# Patient Record
Sex: Male | Born: 1980 | Race: Black or African American | Hispanic: No | Marital: Married | Smoking: Current every day smoker
Health system: Southern US, Community
[De-identification: ages and names within clinical notes are randomized; demographics above are authoritative.]

## PROBLEM LIST (undated history)

## (undated) DIAGNOSIS — J45909 Unspecified asthma, uncomplicated: Secondary | ICD-10-CM

## (undated) DIAGNOSIS — K859 Acute pancreatitis without necrosis or infection, unspecified: Secondary | ICD-10-CM

---

## 1998-06-03 ENCOUNTER — Encounter: Payer: Self-pay | Admitting: Emergency Medicine

## 1998-06-03 ENCOUNTER — Encounter: Payer: Self-pay | Admitting: Infectious Diseases

## 1998-06-03 ENCOUNTER — Inpatient Hospital Stay (HOSPITAL_COMMUNITY): Admission: EM | Admit: 1998-06-03 | Discharge: 1998-06-04 | Payer: Self-pay | Admitting: Emergency Medicine

## 1999-04-20 ENCOUNTER — Emergency Department (HOSPITAL_COMMUNITY): Admission: EM | Admit: 1999-04-20 | Discharge: 1999-04-20 | Payer: Self-pay | Admitting: *Deleted

## 1999-07-04 ENCOUNTER — Emergency Department (HOSPITAL_COMMUNITY): Admission: EM | Admit: 1999-07-04 | Discharge: 1999-07-04 | Payer: Self-pay | Admitting: Emergency Medicine

## 2000-09-02 ENCOUNTER — Emergency Department (HOSPITAL_COMMUNITY): Admission: EM | Admit: 2000-09-02 | Discharge: 2000-09-02 | Payer: Self-pay | Admitting: Emergency Medicine

## 2002-07-04 ENCOUNTER — Emergency Department (HOSPITAL_COMMUNITY): Admission: EM | Admit: 2002-07-04 | Discharge: 2002-07-04 | Payer: Self-pay | Admitting: Emergency Medicine

## 2003-01-19 ENCOUNTER — Emergency Department (HOSPITAL_COMMUNITY): Admission: EM | Admit: 2003-01-19 | Discharge: 2003-01-19 | Payer: Self-pay | Admitting: Emergency Medicine

## 2004-07-27 ENCOUNTER — Emergency Department (HOSPITAL_COMMUNITY): Admission: EM | Admit: 2004-07-27 | Discharge: 2004-07-27 | Payer: Self-pay | Admitting: Family Medicine

## 2005-03-11 ENCOUNTER — Emergency Department (HOSPITAL_COMMUNITY): Admission: EM | Admit: 2005-03-11 | Discharge: 2005-03-11 | Payer: Self-pay | Admitting: Emergency Medicine

## 2007-06-01 ENCOUNTER — Emergency Department (HOSPITAL_COMMUNITY): Admission: EM | Admit: 2007-06-01 | Discharge: 2007-06-01 | Payer: Self-pay | Admitting: Emergency Medicine

## 2008-03-05 ENCOUNTER — Emergency Department (HOSPITAL_COMMUNITY): Admission: EM | Admit: 2008-03-05 | Discharge: 2008-03-06 | Payer: Self-pay | Admitting: Emergency Medicine

## 2009-03-26 ENCOUNTER — Emergency Department (HOSPITAL_COMMUNITY): Admission: EM | Admit: 2009-03-26 | Discharge: 2009-03-26 | Payer: Self-pay | Admitting: Family Medicine

## 2010-08-24 ENCOUNTER — Emergency Department (HOSPITAL_COMMUNITY)
Admission: EM | Admit: 2010-08-24 | Discharge: 2010-08-24 | Payer: Self-pay | Source: Home / Self Care | Admitting: Emergency Medicine

## 2010-08-26 LAB — POCT URINALYSIS DIPSTICK
Bilirubin Urine: NEGATIVE
Hgb urine dipstick: NEGATIVE
Ketones, ur: NEGATIVE mg/dL
Nitrite: NEGATIVE
Protein, ur: NEGATIVE mg/dL
Specific Gravity, Urine: 1.02 (ref 1.005–1.030)
Urine Glucose, Fasting: NEGATIVE mg/dL
Urobilinogen, UA: 1 mg/dL (ref 0.0–1.0)
pH: 7.5 (ref 5.0–8.0)

## 2010-08-26 LAB — GC/CHLAMYDIA PROBE AMP, GENITAL
Chlamydia, DNA Probe: NEGATIVE
GC Probe Amp, Genital: NEGATIVE

## 2010-11-14 LAB — POCT URINALYSIS DIP (DEVICE)
Bilirubin Urine: NEGATIVE
Glucose, UA: NEGATIVE mg/dL
Hgb urine dipstick: NEGATIVE
Ketones, ur: NEGATIVE mg/dL
Nitrite: NEGATIVE
Protein, ur: NEGATIVE mg/dL
Specific Gravity, Urine: 1.02 (ref 1.005–1.030)
Urobilinogen, UA: 1 mg/dL (ref 0.0–1.0)
pH: 8.5 — ABNORMAL HIGH (ref 5.0–8.0)

## 2011-05-07 LAB — POCT I-STAT, CHEM 8
BUN: 9
Calcium, Ion: 1.1 — ABNORMAL LOW
Chloride: 103
Creatinine, Ser: 1.3
Glucose, Bld: 97
HCT: 50
Hemoglobin: 17
Potassium: 3.5
Sodium: 135
TCO2: 24

## 2011-05-07 LAB — URINALYSIS, ROUTINE W REFLEX MICROSCOPIC
Glucose, UA: NEGATIVE
Hgb urine dipstick: NEGATIVE
Ketones, ur: >80 — AB
Nitrite: NEGATIVE
Protein, ur: 30 — AB
Specific Gravity, Urine: 1.036 — ABNORMAL HIGH
Urobilinogen, UA: 1
pH: 8

## 2011-05-07 LAB — URINE MICROSCOPIC-ADD ON

## 2011-11-12 ENCOUNTER — Encounter (HOSPITAL_COMMUNITY): Payer: Self-pay | Admitting: *Deleted

## 2011-11-12 ENCOUNTER — Emergency Department (INDEPENDENT_AMBULATORY_CARE_PROVIDER_SITE_OTHER)
Admission: EM | Admit: 2011-11-12 | Discharge: 2011-11-12 | Disposition: A | Discharge status: Home Health | Payer: Self-pay | Source: Home / Self Care | Attending: Family Medicine | Admitting: Family Medicine

## 2011-11-12 ENCOUNTER — Emergency Department (INDEPENDENT_AMBULATORY_CARE_PROVIDER_SITE_OTHER): Payer: Self-pay

## 2011-11-12 DIAGNOSIS — J111 Influenza due to unidentified influenza virus with other respiratory manifestations: Secondary | ICD-10-CM

## 2011-11-12 DIAGNOSIS — R6889 Other general symptoms and signs: Secondary | ICD-10-CM

## 2011-11-12 MED ORDER — ONDANSETRON HCL 4 MG PO TABS: 4.0000 mg | ORAL_TABLET | Freq: Four times a day (QID) | ORAL | Status: AC

## 2011-11-12 MED ORDER — IBUPROFEN 800 MG PO TABS
800.0000 mg | ORAL_TABLET | Freq: Once | ORAL | Status: AC
  Administered 2011-11-12: 800 mg via ORAL

## 2011-11-12 MED ORDER — ONDANSETRON 4 MG PO TBDP
ORAL_TABLET | ORAL | Status: AC
  Filled 2011-11-12: qty 1

## 2011-11-12 MED ORDER — IBUPROFEN 800 MG PO TABS
ORAL_TABLET | ORAL | Status: AC
  Filled 2011-11-12: qty 1

## 2011-11-12 MED ORDER — IBUPROFEN 800 MG PO TABS: 800.0000 mg | ORAL_TABLET | Freq: Three times a day (TID) | ORAL | Status: AC

## 2011-11-12 MED ORDER — ONDANSETRON 4 MG PO TBDP
4.0000 mg | ORAL_TABLET | Freq: Once | ORAL | Status: AC
  Administered 2011-11-12: 4 mg via ORAL

## 2011-11-12 NOTE — ED Provider Notes (Signed)
History     CSN: [id number]  Arrival date & time 11/12/11  1027   First MD Initiated Contact with Patient 11/12/11 1053      Chief Complaint  Patient presents with  . Cough  . Fever  . Generalized Body Aches    (Consider location/radiation/quality/duration/timing/severity/associated sxs/prior treatment) Patient is a 31 y.o. male presenting with cough and fever. The history is provided by the patient.  Cough This is a new problem. The current episode started more than 2 days ago. The problem occurs constantly. The problem has been gradually worsening. The cough is productive of purulent sputum. Associated symptoms include rhinorrhea, sore throat and myalgias. He is a smoker.  Fever Primary symptoms of the febrile illness include fever, cough, nausea, vomiting, diarrhea and myalgias.    History reviewed. No pertinent past medical history.  History reviewed. No pertinent past surgical history.  History reviewed. No pertinent family history.  History  Substance Use Topics  . Smoking status: Current Everyday Smoker  . Smokeless tobacco: Not on file  . Alcohol Use: No      Review of Systems  Constitutional: Positive for fever.  HENT: Positive for congestion, sore throat and rhinorrhea.   Respiratory: Positive for cough.   Gastrointestinal: Positive for nausea, vomiting and diarrhea.  Musculoskeletal: Positive for myalgias.  Skin: Negative.     Allergies  Review of patient's allergies indicates no known allergies.  Home Medications   Current Outpatient Rx  Name Route Sig Dispense Refill  . IBUPROFEN 800 MG PO TABS Oral Take 1 tablet (800 mg total) by mouth 3 (three) times daily. 30 tablet 0  . ONDANSETRON HCL 4 MG PO TABS Oral Take 1 tablet (4 mg total) by mouth every 6 (six) hours. 8 tablet 0    BP 107/72  Pulse 103  Temp(Src) 101.5 F (38.6 C) (Oral)  Resp 16  SpO2 99%  Physical Exam  Nursing note and vitals reviewed. Constitutional: He is oriented to  person, place, and time. He appears well-developed and well-nourished.  HENT:  Head: Normocephalic.  Right Ear: Tympanic membrane is injected, erythematous and bulging.  Left Ear: Tympanic membrane and external ear normal.  Mouth/Throat: Oropharynx is clear and moist.  Eyes: Conjunctivae are normal. Pupils are equal, round, and reactive to light.  Neck: Normal range of motion. Neck supple.  Cardiovascular: Normal rate and regular rhythm.   Pulmonary/Chest: He has no wheezes. He has rhonchi. He has no rales.  Abdominal: Soft. Bowel sounds are normal. There is tenderness.  Lymphadenopathy:    He has no cervical adenopathy.  Neurological: He is alert and oriented to person, place, and time.  Skin: Skin is warm and dry.    ED Course  Procedures (including critical care time)  Labs Reviewed - No data to display Dg Chest 2 View  11/12/2011  *RADIOLOGY REPORT*  Clinical Data: Productive cough with chest pain and shortness of breath.  History of smoking.  CHEST - 2 VIEW  Comparison: 03/05/2008  Findings: Normal heart size with clear lung fields.  Moderate hyperinflation suggesting COPD.  No effusion or pneumothorax.  No hilar mediastinal contour abnormalities.  Bones unremarkable. Little change from priors.  IMPRESSION: Suspect mild hyperinflation.  No active disease.  Original Report Authenticated By: Elsie Stain, M.D.     1. Influenza-like symptoms       MDM  X-rays reviewed and report per radiologist.         Linna Hoff, MD 11/12/11 1251

## 2011-11-12 NOTE — Discharge Instructions (Signed)
Drink plenty of fluids as discussed, use medicine as prescribed, and mucinex or delsym for cough. Return or see your doctor if further problems °

## 2011-11-12 NOTE — ED Notes (Signed)
Pt is here with complaints of body aches, fever, sore throat, cough and vomiting X 3 days.

## 2013-01-04 ENCOUNTER — Emergency Department (HOSPITAL_COMMUNITY)
Admission: EM | Admit: 2013-01-04 | Discharge: 2013-01-05 | Disposition: A | Discharge status: Home / Self Care | Payer: Self-pay | Attending: Emergency Medicine | Admitting: Emergency Medicine

## 2013-01-04 ENCOUNTER — Emergency Department (INDEPENDENT_AMBULATORY_CARE_PROVIDER_SITE_OTHER)
Admission: EM | Admit: 2013-01-04 | Discharge: 2013-01-04 | Disposition: A | Discharge status: Nursing Home (Includes ICF) | Payer: Self-pay | Source: Home / Self Care

## 2013-01-04 ENCOUNTER — Encounter (HOSPITAL_COMMUNITY): Payer: Self-pay | Admitting: Emergency Medicine

## 2013-01-04 ENCOUNTER — Emergency Department (HOSPITAL_COMMUNITY): Payer: Self-pay

## 2013-01-04 ENCOUNTER — Encounter (HOSPITAL_COMMUNITY): Payer: Self-pay | Admitting: *Deleted

## 2013-01-04 DIAGNOSIS — R109 Unspecified abdominal pain: Secondary | ICD-10-CM

## 2013-01-04 DIAGNOSIS — J029 Acute pharyngitis, unspecified: Secondary | ICD-10-CM | POA: Insufficient documentation

## 2013-01-04 DIAGNOSIS — R509 Fever, unspecified: Secondary | ICD-10-CM

## 2013-01-04 DIAGNOSIS — F17200 Nicotine dependence, unspecified, uncomplicated: Secondary | ICD-10-CM | POA: Insufficient documentation

## 2013-01-04 DIAGNOSIS — R1011 Right upper quadrant pain: Secondary | ICD-10-CM | POA: Insufficient documentation

## 2013-01-04 LAB — CBC
HCT: 47.1 % (ref 39.0–52.0)
Hemoglobin: 16.9 g/dL (ref 13.0–17.0)
MCH: 30.1 pg (ref 26.0–34.0)
MCHC: 35.9 g/dL (ref 30.0–36.0)
MCV: 83.8 fL (ref 78.0–100.0)
Platelets: 162 10*3/uL (ref 150–400)
RBC: 5.62 MIL/uL (ref 4.22–5.81)
RDW: 13.1 % (ref 11.5–15.5)
WBC: 16.9 10*3/uL — ABNORMAL HIGH (ref 4.0–10.5)

## 2013-01-04 LAB — COMPREHENSIVE METABOLIC PANEL
ALT: 31 U/L (ref 0–53)
AST: 21 U/L (ref 0–37)
Albumin: 4.1 g/dL (ref 3.5–5.2)
Alkaline Phosphatase: 75 U/L (ref 39–117)
BUN: 12 mg/dL (ref 6–23)
CO2: 24 mEq/L (ref 19–32)
Calcium: 9.7 mg/dL (ref 8.4–10.5)
Chloride: 99 mEq/L (ref 96–112)
Creatinine, Ser: 0.91 mg/dL (ref 0.50–1.35)
GFR calc Af Amer: >90 mL/min (ref >90)
GFR calc non Af Amer: >90 mL/min (ref >90)
Glucose, Bld: 92 mg/dL (ref 70–99)
Potassium: 3.7 mEq/L (ref 3.5–5.1)
Sodium: 135 mEq/L (ref 135–145)
Total Bilirubin: 0.6 mg/dL (ref 0.3–1.2)
Total Protein: 7.7 g/dL (ref 6.0–8.3)

## 2013-01-04 LAB — URINALYSIS, ROUTINE W REFLEX MICROSCOPIC
Glucose, UA: NEGATIVE mg/dL
Hgb urine dipstick: NEGATIVE
Ketones, ur: >80 mg/dL — AB
Leukocytes, UA: NEGATIVE
Nitrite: NEGATIVE
Protein, ur: 30 mg/dL — AB
Specific Gravity, Urine: 1.044 — ABNORMAL HIGH (ref 1.005–1.030)
Urobilinogen, UA: >8 mg/dL — ABNORMAL HIGH (ref 0.0–1.0)
pH: 7.5 (ref 5.0–8.0)

## 2013-01-04 LAB — URINE MICROSCOPIC-ADD ON

## 2013-01-04 LAB — LIPASE, BLOOD: Lipase: 27 U/L (ref 11–59)

## 2013-01-04 MED ORDER — SODIUM CHLORIDE 0.9 % IV BOLUS (SEPSIS)
2000.0000 mL | Freq: Once | INTRAVENOUS | Status: AC
  Administered 2013-01-04: 2000 mL via INTRAVENOUS

## 2013-01-04 MED ORDER — ACETAMINOPHEN 325 MG PO TABS
650.0000 mg | ORAL_TABLET | Freq: Once | ORAL | Status: AC
  Administered 2013-01-04: 650 mg via ORAL
  Filled 2013-01-04: qty 2

## 2013-01-04 NOTE — ED Provider Notes (Signed)
History     CSN: [id number]  Arrival date & time 01/04/13  1618   None     Chief Complaint  Patient presents with  . Sore Throat    (Consider location/radiation/quality/duration/timing/severity/associated sxs/prior treatment) HPI Comments: Pt presents for eval of sore throat, abdominal pain, weakness, fever, chills.  This started 2 days ago with sore throat only.  Soon after he began to get fever/chills and weakness, stating that he became so weak yesterday that he could no longer stand.  After that the abdominal pain started which he states is now his most severe pain.  Pt states the worst part is around his right flank but it hurts all over.  He is nauseated as well but has not vomited.  Pt states he has also been having difficulty moving his bowels, although last BM was this morning.  Denies vomiting, diarrhea, chest pain, SOB, palpitations.    Patient is a 32 y.o. male presenting with pharyngitis.  Sore Throat Associated symptoms include abdominal pain. Pertinent negatives include no chest pain and no shortness of breath.    History reviewed. No pertinent past medical history.  History reviewed. No pertinent past surgical history.  No family history on file.  History  Substance Use Topics  . Smoking status: Current Every Day Smoker  . Smokeless tobacco: Not on file  . Alcohol Use: No      Review of Systems  Constitutional: Positive for fever, chills and fatigue.  HENT: Positive for sore throat. Negative for neck pain and neck stiffness.   Eyes: Negative for visual disturbance.  Respiratory: Negative for cough and shortness of breath.   Cardiovascular: Negative for chest pain, palpitations and leg swelling.  Gastrointestinal: Positive for nausea, abdominal pain and constipation. Negative for vomiting, diarrhea and blood in stool.  Genitourinary: Positive for flank pain. Negative for dysuria, urgency, frequency and hematuria.  Musculoskeletal: Negative for myalgias and  arthralgias.  Skin: Negative for rash.  Neurological: Positive for dizziness, weakness and light-headedness.    Allergies  Review of patient's allergies indicates no known allergies.  Home Medications   Current Outpatient Rx  Name  Route  Sig  Dispense  Refill  . Naproxen Sodium (ALEVE PO)   Oral   Take by mouth.         . Pseudoeph-Doxylamine-DM-APAP (NYQUIL PO)   Oral   Take by mouth.           BP 126/84  Pulse 91  Temp(Src) 100.5 F (38.1 C) (Oral)  Resp 20  SpO2 97%  Physical Exam  Nursing note and vitals reviewed. Constitutional: He is oriented to person, place, and time. He appears well-developed and well-nourished. He appears ill. He appears distressed.  HENT:  Head: Normocephalic and atraumatic.  Mouth/Throat: Uvula is midline. Posterior oropharyngeal edema present.  Eyes: EOM are normal. Pupils are equal, round, and reactive to light.  Cardiovascular: Normal rate and regular rhythm.  Exam reveals no gallop and no friction rub.   No murmur heard. Pulmonary/Chest: Effort normal and breath sounds normal. No respiratory distress. He has no wheezes. He has no rales.  Abdominal: Soft. Normal appearance. There is generalized tenderness. There is rebound, CVA tenderness, tenderness at McBurney's point and positive Murphy's sign.  Neurological: He is oriented to person, place, and time.  Skin: Skin is warm and dry. No rash noted.  Psychiatric: He has a normal mood and affect. Judgment normal.    ED Course  Procedures (including critical care time)  Labs Reviewed -  No data to display No results found.   1. Abdominal pain   2. Fever       MDM  Pt appears to have potentially acute abdomen.  Transferring to ED for further workup         Leonard Good, PA-C 01/04/13 1733

## 2013-01-04 NOTE — ED Notes (Signed)
Pt reports sore throat, abdominal pain that started 3 days ago.  Reports chills.  Denies urinary symptoms.

## 2013-01-04 NOTE — ED Notes (Signed)
NURSE FIRST ROUNDS : NURSE EXPLAINED DELAY , WAIT TIME AND PROCESS TO PT, DENIES PAIN AT THIS TIME , RESPIRATIONS UNLABORED , SITTING ON WHEELCHAIR WITH FAMILY AT WAITING AREA.

## 2013-01-04 NOTE — ED Notes (Addendum)
C/o sore throat, chills, hot flashes, sore throat onset 3 days ago.  Family was treated for strep 2 weeks ago

## 2013-01-04 NOTE — ED Notes (Signed)
NURSE FIRST ROUNDS ; TYLENOL 650 PO GIVEN FOR FEVER .

## 2013-01-04 NOTE — ED Provider Notes (Signed)
Medical screening examination/treatment/procedure(s) were performed by non-physician practitioner and as supervising physician I was immediately available for consultation/collaboration.  Leslee Home, M.D.  Reuben Likes, MD 01/04/13 [phone]

## 2013-01-05 ENCOUNTER — Encounter (HOSPITAL_COMMUNITY): Payer: Self-pay | Admitting: *Deleted

## 2013-01-05 ENCOUNTER — Emergency Department (HOSPITAL_COMMUNITY): Payer: Self-pay

## 2013-01-05 ENCOUNTER — Emergency Department (HOSPITAL_COMMUNITY)
Admission: EM | Admit: 2013-01-05 | Discharge: 2013-01-06 | Disposition: A | Discharge status: Home / Self Care | Payer: Self-pay | Attending: Emergency Medicine | Admitting: Emergency Medicine

## 2013-01-05 DIAGNOSIS — J029 Acute pharyngitis, unspecified: Secondary | ICD-10-CM | POA: Insufficient documentation

## 2013-01-05 DIAGNOSIS — F17200 Nicotine dependence, unspecified, uncomplicated: Secondary | ICD-10-CM | POA: Insufficient documentation

## 2013-01-05 DIAGNOSIS — R1310 Dysphagia, unspecified: Secondary | ICD-10-CM | POA: Insufficient documentation

## 2013-01-05 DIAGNOSIS — R197 Diarrhea, unspecified: Secondary | ICD-10-CM | POA: Insufficient documentation

## 2013-01-05 DIAGNOSIS — D72829 Elevated white blood cell count, unspecified: Secondary | ICD-10-CM | POA: Insufficient documentation

## 2013-01-05 DIAGNOSIS — K7689 Other specified diseases of liver: Secondary | ICD-10-CM | POA: Insufficient documentation

## 2013-01-05 DIAGNOSIS — R079 Chest pain, unspecified: Secondary | ICD-10-CM | POA: Insufficient documentation

## 2013-01-05 DIAGNOSIS — K769 Liver disease, unspecified: Secondary | ICD-10-CM

## 2013-01-05 DIAGNOSIS — R509 Fever, unspecified: Secondary | ICD-10-CM | POA: Insufficient documentation

## 2013-01-05 DIAGNOSIS — R1011 Right upper quadrant pain: Secondary | ICD-10-CM | POA: Insufficient documentation

## 2013-01-05 DIAGNOSIS — R05 Cough: Secondary | ICD-10-CM | POA: Insufficient documentation

## 2013-01-05 DIAGNOSIS — R059 Cough, unspecified: Secondary | ICD-10-CM | POA: Insufficient documentation

## 2013-01-05 LAB — COMPREHENSIVE METABOLIC PANEL
Alkaline Phosphatase: 71 U/L (ref 39–117)
BUN: 9 mg/dL (ref 6–23)
CO2: 24 mEq/L (ref 19–32)
GFR calc Af Amer: >90 mL/min (ref >90)
GFR calc non Af Amer: >90 mL/min (ref >90)
Glucose, Bld: 98 mg/dL (ref 70–99)
Potassium: 3.6 mEq/L (ref 3.5–5.1)
Total Bilirubin: 0.4 mg/dL (ref 0.3–1.2)
Total Protein: 7.5 g/dL (ref 6.0–8.3)

## 2013-01-05 LAB — CBC WITH DIFFERENTIAL/PLATELET
Eosinophils Absolute: 0.2 10*3/uL (ref 0.0–0.7)
Hemoglobin: 15.1 g/dL (ref 13.0–17.0)
Lymphocytes Relative: 16 % (ref 12–46)
Lymphs Abs: 2.4 10*3/uL (ref 0.7–4.0)
MCH: 28.9 pg (ref 26.0–34.0)
MCV: 83.4 fL (ref 78.0–100.0)
Monocytes Relative: 16 % — ABNORMAL HIGH (ref 3–12)
Neutrophils Relative %: 67 % (ref 43–77)
RBC: 5.23 MIL/uL (ref 4.22–5.81)

## 2013-01-05 LAB — URINALYSIS, ROUTINE W REFLEX MICROSCOPIC
Hgb urine dipstick: NEGATIVE
Ketones, ur: 15 mg/dL — AB
Nitrite: NEGATIVE
Protein, ur: 100 mg/dL — AB
Specific Gravity, Urine: 1.041 — ABNORMAL HIGH (ref 1.005–1.030)
Urobilinogen, UA: 4 mg/dL — ABNORMAL HIGH (ref 0.0–1.0)

## 2013-01-05 LAB — RAPID STREP SCREEN (MED CTR MEBANE ONLY): Streptococcus, Group A Screen (Direct): NEGATIVE

## 2013-01-05 LAB — LIPASE, BLOOD: Lipase: 56 U/L (ref 11–59)

## 2013-01-05 MED ORDER — HYDROMORPHONE HCL PF 1 MG/ML IJ SOLN
0.5000 mg | Freq: Once | INTRAMUSCULAR | Status: AC
  Administered 2013-01-05: 0.5 mg via INTRAVENOUS
  Filled 2013-01-05: qty 1

## 2013-01-05 MED ORDER — ONDANSETRON HCL 4 MG/2ML IJ SOLN
4.0000 mg | Freq: Once | INTRAMUSCULAR | Status: AC
  Administered 2013-01-05: 4 mg via INTRAVENOUS
  Filled 2013-01-05: qty 2

## 2013-01-05 MED ORDER — TRAMADOL HCL 50 MG PO TABS: 50.0000 mg | ORAL_TABLET | Freq: Four times a day (QID) | ORAL | Status: DC | PRN

## 2013-01-05 MED ORDER — SODIUM CHLORIDE 0.9 % IV SOLN
1000.0000 mL | Freq: Once | INTRAVENOUS | Status: AC
  Administered 2013-01-05: 1000 mL via INTRAVENOUS

## 2013-01-05 MED ORDER — FAMOTIDINE IN NACL 20-0.9 MG/50ML-% IV SOLN
20.0000 mg | Freq: Once | INTRAVENOUS | Status: AC
  Administered 2013-01-05: 20 mg via INTRAVENOUS
  Filled 2013-01-05: qty 50

## 2013-01-05 MED ORDER — MORPHINE SULFATE 4 MG/ML IJ SOLN
4.0000 mg | Freq: Once | INTRAMUSCULAR | Status: AC
  Administered 2013-01-05: 4 mg via INTRAVENOUS
  Filled 2013-01-05: qty 1

## 2013-01-05 MED ORDER — IBUPROFEN 800 MG PO TABS: 800.0000 mg | ORAL_TABLET | Freq: Three times a day (TID) | ORAL | Status: DC

## 2013-01-05 MED ORDER — ACETAMINOPHEN 325 MG PO TABS
650.0000 mg | ORAL_TABLET | Freq: Once | ORAL | Status: AC
  Administered 2013-01-06: 650 mg via ORAL
  Filled 2013-01-05: qty 2

## 2013-01-05 NOTE — ED Notes (Signed)
Pt c/o RUQ abdominal pain, sore throat, chills, fever x 4 days.

## 2013-01-05 NOTE — ED Provider Notes (Addendum)
History     CSN: [id number]  Arrival date & time 01/04/13  1739   First MD Initiated Contact with Patient 01/04/13 2308      Chief Complaint  Patient presents with  . Abdominal Pain    (Consider location/radiation/quality/duration/timing/severity/associated sxs/prior treatment) HPI  Patient is a generally healthy 32 yo man who presents with complaints of RUQ abdominal pain and ST. ST started 3d ago and abdominal pain approx 36 hrs ago. Patient reports diminished appetite and po intake. Denies vomiting, diarrhea, melena, hematochezia and history of similar abdominal pain. Patient has not history of abdominal surgeries. Non drinker. Pain is aching, 8/10 and nonradiating. Worse when patient lays in the right lateral decubitus position.   Pain in throat is bilateral. Pt has not had any difficulty swallowing liquids or secreations. This pain is moderate, nonradiating, worse with swallowing. Pt has not appreciated any oral or phayngeal lesions. No URI sx.   History  Substance Use Topics  . Smoking status: Current Every Day Smoker -- 1.00 packs/day    Types: Cigarettes  . Smokeless tobacco: Not on file  . Alcohol Use: No      Review of Systems Gen: no weight loss, fevers, chills, night sweats Eyes: no discharge or drainage, no occular pain or visual changes Nose: no epistaxis or rhinorrhea Mouth: As per history of present illness, otherwise negative Neck: no neck pain Lungs: no SOB, cough, wheezing CV: no chest pain, palpitations, dependent edema or orthopnea Abd: As per history of present illness, otherwise negative GU: no dysuria or gross hematuria MSK: no myalgias or arthralgias Neuro: no headache, no focal neurologic deficits Skin: no rash Psyche: negative.  Allergies  Review of patient's allergies indicates no known allergies.  Home Medications   Current Outpatient Rx  Name  Route  Sig  Dispense  Refill  . naproxen sodium (ANAPROX) 220 MG tablet   Oral   Take 440  mg by mouth 2 (two) times daily as needed (for pain).         . Pseudoeph-Doxylamine-DM-APAP (NYQUIL PO)   Oral   Take 120 mLs by mouth daily as needed (for congestion/feeling sick).            BP 117/75  Pulse 93  Temp(Src) 98 F (36.7 C) (Oral)  Resp 20  SpO2 100%  Physical Exam Gen: well developed and well nourished appearing Head: NCAT Eyes: PERL, EOMI Nose: no epistaixis or rhinorrhea Mouth/throat: mucosa is moist and pink posterior oropharynx appears inflamed, no tonsillar exudate, the uvula is midline Neck: supple, no stridor, no adenopathy Lungs: CTA B, no wheezing, rhonchi or rales Heart-regular rate and rhythm, no murmur, extremities well perfused Abd: soft, no abdominal tenderness is appreciated on palpation nondistended Back: no ttp, no cva ttp Skin: no rashese, wnl Neuro: CN ii-xii grossly intact, no focal deficits Psyche; normal affect,  calm and cooperative.   ED Course  Procedures (including critical care time)  Results for orders placed during the hospital encounter of 01/04/13 (from the past 24 hour(s))  CBC     Status: Abnormal   Collection Time    01/04/13  6:43 PM      Result Value Range   WBC 16.9 (*) 4.0 - 10.5 K/uL   RBC 5.62  4.22 - 5.81 MIL/uL   Hemoglobin 16.9  13.0 - 17.0 g/dL   HCT 14.7  82.9 - 56.2 %   MCV 83.8  78.0 - 100.0 fL   MCH 30.1  26.0 - 34.0 pg  MCHC 35.9  30.0 - 36.0 g/dL   RDW 16.1  09.6 - 04.5 %   Platelets 162  150 - 400 K/uL  COMPREHENSIVE METABOLIC PANEL     Status: None   Collection Time    01/04/13  6:43 PM      Result Value Range   Sodium 135  135 - 145 mEq/L   Potassium 3.7  3.5 - 5.1 mEq/L   Chloride 99  96 - 112 mEq/L   CO2 24  19 - 32 mEq/L   Glucose, Bld 92  70 - 99 mg/dL   BUN 12  6 - 23 mg/dL   Creatinine, Ser 4.09  0.50 - 1.35 mg/dL   Calcium 9.7  8.4 - 81.1 mg/dL   Total Protein 7.7  6.0 - 8.3 g/dL   Albumin 4.1  3.5 - 5.2 g/dL   AST 21  0 - 37 U/L   ALT 31  0 - 53 U/L   Alkaline Phosphatase  75  39 - 117 U/L   Total Bilirubin 0.6  0.3 - 1.2 mg/dL   GFR calc non Af Amer >90  >90 mL/min   GFR calc Af Amer >90  >90 mL/min  LIPASE, BLOOD     Status: None   Collection Time    01/04/13  6:43 PM      Result Value Range   Lipase 27  11 - 59 U/L  URINALYSIS, ROUTINE W REFLEX MICROSCOPIC     Status: Abnormal   Collection Time    01/04/13  6:56 PM      Result Value Range   Color, Urine AMBER (*) YELLOW   APPearance CLEAR  CLEAR   Specific Gravity, Urine 1.044 (*) 1.005 - 1.030   pH 7.5  5.0 - 8.0   Glucose, UA NEGATIVE  NEGATIVE mg/dL   Hgb urine dipstick NEGATIVE  NEGATIVE   Bilirubin Urine SMALL (*) NEGATIVE   Ketones, ur >80 (*) NEGATIVE mg/dL   Protein, ur 30 (*) NEGATIVE mg/dL   Urobilinogen, UA >9.1 (*) 0.0 - 1.0 mg/dL   Nitrite NEGATIVE  NEGATIVE   Leukocytes, UA NEGATIVE  NEGATIVE  URINE MICROSCOPIC-ADD ON     Status: None   Collection Time    01/04/13  6:56 PM      Result Value Range   Squamous Epithelial / LPF RARE  RARE   WBC, UA 0-2  <3 WBC/hpf   RBC / HPF 0-2  <3 RBC/hpf   Bacteria, UA RARE  RARE  RAPID STREP SCREEN     Status: None   Collection Time    01/04/13 11:55 PM      Result Value Range   Streptococcus, Group A Screen (Direct) NEGATIVE  NEGATIVE    Dg Chest 2 View  01/04/2013   *RADIOLOGY REPORT*  Clinical Data: Right upper quadrant abdominal pain, cough and sore throat.  History of smoking.  CHEST - 2 VIEW  Comparison: Chest radiograph performed 11/12/2011  Findings: The lungs are well-aerated and clear.  There is no evidence of focal opacification, pleural effusion or pneumothorax.  The heart is normal in size; the mediastinal contour is within normal limits.  No acute osseous abnormalities are seen.  IMPRESSION: No acute cardiopulmonary process seen.   Original Report Authenticated By: Tonia Ghent, M.D.     U/S RUQ:  See Rad report, no signs of cholecystitis or biliary obstruction.    MDM  ED work up is non-diagnostic for cause of abdominal  pain, fever and  elevated WBC. However, pain has resolved and the patient currently has an entirely benign abdominal exam. As with his chief complaint, the patient's only complaint at this time is ongoing ST.   We have ruled out strep. No PTA. Suspect viral syndrome. Patient tolerating fluids and stable for d/c with plan for outpatient f/u and counsel to return to the ED for red flag sx.         Brandt Loosen, MD 01/05/13 1610  Brandt Loosen, MD 01/25/13 [phone]

## 2013-01-05 NOTE — ED Provider Notes (Signed)
History     CSN: [id number]  Arrival date & time 01/05/13  2116   First MD Initiated Contact with Patient 01/05/13 2207      Chief Complaint  Patient presents with  . Abdominal Pain  . Sore Throat    (Consider location/radiation/quality/duration/timing/severity/associated sxs/prior treatment) HPI  Leonard Silva is a 32 y.o. male complaining of worsening right upper quadrant abdominal pain, fever temperature maximum of 102.1 earlier in the day, chills, sore throat worsening over the course last 4 days. Patient has pleuritic chest pain and productive cough starting today. He had one episode of diarrhea yesterday he denies nausea or vomiting but states that his shortness of toward that he has difficulty swallowing. He rates his pain as 10 out of 10, aching and is exacerbated by certain positions and movement. Patient denies sick contacts, rhinorrhea, emesis.    History reviewed. No pertinent past medical history.  History reviewed. No pertinent past surgical history.  History reviewed. No pertinent family history.  History  Substance Use Topics  . Smoking status: Current Every Day Smoker -- 1.00 packs/day    Types: Cigarettes  . Smokeless tobacco: Not on file  . Alcohol Use: No      Review of Systems  Constitutional: Positive for fever and chills.  HENT: Positive for sore throat.   Respiratory: Positive for cough. Negative for shortness of breath.   Cardiovascular: Positive for chest pain.  Gastrointestinal: Positive for abdominal pain and diarrhea. Negative for nausea and vomiting.  All other systems reviewed and are negative.    Allergies  Review of patient's allergies indicates no known allergies.  Home Medications   Current Outpatient Rx  Name  Route  Sig  Dispense  Refill  . ibuprofen (ADVIL,MOTRIN) 800 MG tablet   Oral   Take 1 tablet (800 mg total) by mouth 3 (three) times daily.   21 tablet   0   . naproxen sodium (ANAPROX) 220 MG tablet   Oral    Take 440 mg by mouth 2 (two) times daily as needed (for pain).         . Pseudoeph-Doxylamine-DM-APAP (NYQUIL PO)   Oral   Take 120 mLs by mouth daily as needed (for congestion/feeling sick).          . traMADol (ULTRAM) 50 MG tablet   Oral   Take 1 tablet (50 mg total) by mouth every 6 (six) hours as needed for pain.   15 tablet   0     BP 117/80  Pulse 83  Temp(Src) 98.6 F (37 C) (Oral)  Resp 18  SpO2 99%  Physical Exam  Nursing note and vitals reviewed. Constitutional: He is oriented to person, place, and time. He appears well-developed and well-nourished. No distress.  HENT:  Head: Normocephalic and atraumatic.  Mouth/Throat: Oropharynx is clear and moist. No oropharyngeal exudate.  Eyes: Conjunctivae and EOM are normal. Pupils are equal, round, and reactive to light.  Neck: Normal range of motion.  Cardiovascular: Normal rate, regular rhythm and intact distal pulses.   Pulmonary/Chest: Effort normal and breath sounds normal. No stridor. No respiratory distress. He has no wheezes. He has no rales. He exhibits no tenderness.  Abdominal: Soft. Bowel sounds are normal. He exhibits no distension and no mass. There is tenderness. There is no rebound and no guarding.  Patient with right upper and right lower quadrant pain, no guarding or rebound.  Musculoskeletal: Normal range of motion. He exhibits no edema.  Neurological: He is alert  and oriented to person, place, and time.  Psychiatric: He has a normal mood and affect.    ED Course  Procedures (including critical care time)  Labs Reviewed  CBC WITH DIFFERENTIAL - Abnormal; Notable for the following:    WBC 14.5 (*)    Neutro Abs 9.7 (*)    Monocytes Relative 16 (*)    Monocytes Absolute 2.3 (*)    All other components within normal limits  URINALYSIS, ROUTINE W REFLEX MICROSCOPIC - Abnormal; Notable for the following:    Color, Urine AMBER (*)    APPearance CLOUDY (*)    Specific Gravity, Urine 1.041 (*)     Bilirubin Urine SMALL (*)    Ketones, ur 15 (*)    Protein, ur 100 (*)    Urobilinogen, UA 4.0 (*)    Leukocytes, UA TRACE (*)    All other components within normal limits  URINE MICROSCOPIC-ADD ON - Abnormal; Notable for the following:    Squamous Epithelial / LPF FEW (*)    All other components within normal limits  COMPREHENSIVE METABOLIC PANEL  LIPASE, BLOOD   Ct Abdomen Pelvis Wo Contrast  01/05/2013   *RADIOLOGY REPORT*  Clinical Data: Right upper quadrant abdominal pain  CT ABDOMEN AND PELVIS WITHOUT CONTRAST  Technique:  Multidetector CT imaging of the abdomen and pelvis was performed following the standard protocol without intravenous contrast.  Comparison: 01/05/2013 ultrasound, 03/11/2005 CT  Findings: Limited images through the lung bases demonstrate no significant appreciable abnormality. The heart size is within normal limits. No pleural or pericardial effusion.  Organ abnormality/lesion detection is limited in the absence of intravenous contrast. Within this limitation, unremarkable biliary system, spleen, pancreas, adrenal glands.  Within the liver, there is a 3.7 cm hypodensity within segment 4a on image 17 and rounded hypodensity within the periphery of segment 7/8 on image 14.  Symmetric renal size.  Slightly dense medullary pyramids may reflect concentrated urine or medullary nephrocalcinosis.  No hydronephrosis or hydroureter.  Contracted bladder.  Decompressed colon limits evaluation.  No overt colitis.  Normal appendix. Small bowel loops are normal course and caliber.  No free intraperitoneal air. Trace free fluid within the pelvis is a nonspecific however abnormal finding in a male.  No lymphadenopathy.  Normal caliber aorta and branch vessels.  No acute osseous finding.  IMPRESSION: Two rounded lesions within the liver are favored to reflect hemangioma when correlated with the recent ultrasound.  Given the patient's symptoms correspond to the right upper quadrant, consider  definitive characterization with MRI in the non emergent setting.  Slightly hyperdense medullary pyramids may reflect concentrated urine or mild medullary nephrocalcinosis.  No ureteral calculi or hydronephrosis.  Normal appendix.  Trace free fluid within the pelvis, a nonspecific however abnormal finding in a male patient.   Original Report Authenticated By: Jearld Lesch, M.D.   Dg Chest 2 View  01/04/2013   *RADIOLOGY REPORT*  Clinical Data: Right upper quadrant abdominal pain, cough and sore throat.  History of smoking.  CHEST - 2 VIEW  Comparison: Chest radiograph performed 11/12/2011  Findings: The lungs are well-aerated and clear.  There is no evidence of focal opacification, pleural effusion or pneumothorax.  The heart is normal in size; the mediastinal contour is within normal limits.  No acute osseous abnormalities are seen.  IMPRESSION: No acute cardiopulmonary process seen.   Original Report Authenticated By: Tonia Ghent, M.D.   US Abdomen Complete  01/05/2013   *RADIOLOGY REPORT*  Clinical Data:  Right upper quadrant  abdominal pain, fever and leukocytosis.  ABDOMINAL ULTRASOUND COMPLETE  Comparison:  CT of the abdomen and pelvis performed 03/11/2005  Findings:  Gallbladder:  The gallbladder is normal in appearance, without evidence for gallstones, gallbladder wall thickening or pericholecystic fluid.  No ultrasonographic Murphy's sign is elicited.  Common Bile Duct:  0.4 cm in diameter; within normal limits in caliber.  Liver:  Normal parenchymal echogenicity and echotexture; two foci of mildly increased echogenicity are seen within the right hepatic lobe, measuring 3.3 cm and 1.4 cm.  One of these is somewhat heterogeneous, though the appearance remains most compatible with hemangiomas.  Limited Doppler evaluation demonstrates normal blood flow within the liver.  IVC:  Unremarkable in appearance.  Pancreas:  Although the pancreas is difficult to visualize due to overlying bowel gas, no focal  pancreatic abnormality is identified.  Spleen:  12.5 cm in length; within normal limits in size and echotexture.  Right kidney:  10.1 cm in length; normal in size, configuration and parenchymal echogenicity.  No evidence of mass or hydronephrosis.  Left kidney:  11.4 cm in length; normal in size, configuration and parenchymal echogenicity.  No evidence of mass or hydronephrosis.  Abdominal Aorta:  Normal in caliber; no aneurysm identified.  Not characterized distally due to overlying bowel gas.  IMPRESSION:  1.  No acute abnormality seen within the abdomen. 2.  Two mildly hyperechoic lesions noted within the right hepatic lobe, measuring 3.3 cm and 1.4 cm, likely reflecting hemangiomas. Would consider correlation with LFTs.   Original Report Authenticated By: Tonia Ghent, M.D.    1. Abdominal pain, RUQ   2. Diarrhea   3. Pharyngitis   4. Leukocytosis   5. Liver lesion      MDM   Filed Vitals:   01/05/13 2119  BP: 117/80  Pulse: 83  Temp: 98.6 F (37 C)  TempSrc: Oral  Resp: 18  SpO2: 99%     Leonard Silva is a 32 y.o. male  severe right upper quadrant pain, abdominal exam is nonsurgical. Associated with fever, pharyngitis and diarrhea. Vitals signs WNL. Patient ultrasound, chest x-ray, rapid strep screen and mono screen which were all negative yesterday.  Blood work shows a improvement in his leukocytosis from 16.9 yesterday 14.5 today. Liver function tests and lipase are normal. Urinalysis is not consistent with infection. Noncontrast CT abdomen pelvis shows normal appendix, there are several liver lesions noted, likely hemangiomas.  Medications  ondansetron (ZOFRAN) injection 4 mg (4 mg Intravenous Given 01/05/13 2311)  0.9 %  sodium chloride infusion (1,000 mLs Intravenous New Bag/Given 01/05/13 2310)  HYDROmorphone (DILAUDID) injection 0.5 mg (0.5 mg Intravenous Given 01/05/13 2313)  acetaminophen (TYLENOL) tablet 650 mg (650 mg Oral Given 01/06/13 0002)    The patient is  hemodynamically stable, appropriate for, and amenable to, discharge at this time. Pt verbalized understanding and agrees with care plan. Outpatient follow-up and return precautions given.    New Prescriptions   LIDOCAINE (XYLOCAINE) 2 % SOLUTION    Take 15 mLs by mouth every 3 (three) hours as needed for pain.   OXYCODONE-ACETAMINOPHEN (PERCOCET/ROXICET) 5-325 MG PER TABLET    1 to 2 tabs PO q6hrs  PRN for pain   PROMETHAZINE (PHENERGAN) 25 MG TABLET    Take 1 tablet (25 mg total) by mouth every 6 (six) hours as needed for nausea.           Wynetta Emery, PA-C 01/06/13 [phone]

## 2013-01-05 NOTE — ED Notes (Signed)
Pt dc to home. Pt sts understanding to dc instructions. Pt ambulatory to exit without difficulty.  Pt denies need for w/c.  

## 2013-01-06 MED ORDER — OXYCODONE-ACETAMINOPHEN 5-325 MG PO TABS: ORAL_TABLET | ORAL | Status: DC

## 2013-01-06 MED ORDER — LIDOCAINE VISCOUS 2 % MT SOLN: 15.0000 mL | OROMUCOSAL | Status: DC | PRN

## 2013-01-06 MED ORDER — PROMETHAZINE HCL 25 MG PO TABS: 25.0000 mg | ORAL_TABLET | Freq: Four times a day (QID) | ORAL | Status: DC | PRN

## 2013-01-06 NOTE — ED Provider Notes (Signed)
Medical screening examination/treatment/procedure(s) were performed by non-physician practitioner and as supervising physician I was immediately available for consultation/collaboration.   Reha Martinovich H Andi Mahaffy, MD 01/06/13 1514 

## 2013-01-07 LAB — CULTURE, GROUP A STREP

## 2013-01-08 NOTE — Progress Notes (Signed)
ED Antimicrobial Stewardship Positive Culture Follow Up   Leonard Silva is an 32 y.o. male who presented to [facility] on 01/05/2013 with a chief complaint of worsening RUQ abdominal pain, fever, chills, and sore throat x 4 days  Chief Complaint  Patient presents with  . Abdominal Pain  . Sore Throat    Recent Results (from the past 720 hour(s))  RAPID STREP SCREEN     Status: None   Collection Time    01/04/13 11:55 PM      Result Value Range Status   Streptococcus, Group A Screen (Direct) NEGATIVE  NEGATIVE Final   Comment: (NOTE)     A Rapid Antigen test may result negative if the antigen level in the     sample is below the detection level of this test. The FDA has not     cleared this test as a stand-alone test therefore the rapid antigen     negative result has reflexed to a Group A Strep culture.  CULTURE, GROUP A STREP     Status: None   Collection Time    01/05/13 12:13 AM      Result Value Range Status   Specimen Description THROAT   Final   Special Requests NONE   Final   Culture GROUP A STREP (S.PYOGENES) ISOLATED   Final   Report Status 01/07/2013 FINAL   Final  CULTURE, BLOOD (ROUTINE X 2)     Status: None   Collection Time    01/05/13 12:15 AM      Result Value Range Status   Specimen Description BLOOD LEFT HAND   Final   Special Requests BOTTLES DRAWN AEROBIC ONLY 10CC   Final   Culture  Setup Time 01/05/2013 10:30   Final   Culture     Final   Value:        BLOOD CULTURE RECEIVED NO GROWTH TO DATE CULTURE WILL BE HELD FOR 5 DAYS BEFORE ISSUING A FINAL NEGATIVE REPORT   Report Status PENDING   Incomplete  CULTURE, BLOOD (ROUTINE X 2)     Status: None   Collection Time    01/05/13 12:18 AM      Result Value Range Status   Specimen Description BLOOD RIGHT ARM   Final   Special Requests BOTTLES DRAWN AEROBIC ONLY 10CC   Final   Culture  Setup Time 01/05/2013 10:31   Final   Culture     Final   Value:        BLOOD CULTURE RECEIVED NO GROWTH TO DATE CULTURE  WILL BE HELD FOR 5 DAYS BEFORE ISSUING A FINAL NEGATIVE REPORT   Report Status PENDING   Incomplete    []  Treated with , organism resistant to prescribed antimicrobial [x]  Patient discharged originally without antimicrobial agent and treatment is now indicated  New antibiotic prescription: Penicillin VK 500 mg twice daily x 10 days  ED Provider: Fayrene Helper, PA-C   [name] 01/08/2013, 10:21 AM Infectious Diseases Pharmacist Phone# [phone]

## 2013-01-08 NOTE — ED Notes (Signed)
Post ED Visit - Positive Culture Follow-up: Successful Patient Follow-Up  Culture assessed and recommendations reviewed by: []  Wes Dulaney, Pharm.D., BCPS []  Celedonio Miyamoto, Pharm.D., BCPS [x]  Georgina Pillion, Pharm.D., BCPS []  [place], [address].D., BCPS, AAHIVP []  Estella Husk, Pharm.D., BCPS, AAHIVP  Positive strep group A culture  []  Patient discharged without antimicrobial prescription and treatment is now indicated [x]  Organism is resistant to prescribed ED discharge antimicrobial []  Patient with positive blood cultures  Changes discussed with ED provider: Fayrene Helper New antibiotic prescription PCN VK 500 mg twice daily x 10 days    Contacted patient no answer, date 01/08/2013 time 1624  Larena Sox 01/08/2013, 4:05 PM

## 2013-01-10 ENCOUNTER — Telehealth (HOSPITAL_COMMUNITY): Payer: Self-pay | Admitting: Emergency Medicine

## 2013-01-11 LAB — CULTURE, BLOOD (ROUTINE X 2)
Culture: NO GROWTH
Culture: NO GROWTH

## 2013-04-11 ENCOUNTER — Encounter (HOSPITAL_COMMUNITY): Payer: Self-pay | Admitting: Emergency Medicine

## 2013-04-11 ENCOUNTER — Emergency Department (HOSPITAL_COMMUNITY)
Admission: EM | Admit: 2013-04-11 | Discharge: 2013-04-11 | Disposition: A | Discharge status: Home / Self Care | Payer: Self-pay | Attending: Emergency Medicine | Admitting: Emergency Medicine

## 2013-04-11 DIAGNOSIS — M549 Dorsalgia, unspecified: Secondary | ICD-10-CM | POA: Insufficient documentation

## 2013-04-11 DIAGNOSIS — F17200 Nicotine dependence, unspecified, uncomplicated: Secondary | ICD-10-CM | POA: Insufficient documentation

## 2013-04-11 MED ORDER — HYDROCODONE-ACETAMINOPHEN 5-325 MG PO TABS
1.0000 | ORAL_TABLET | Freq: Once | ORAL | Status: AC
  Administered 2013-04-11: 1 via ORAL
  Filled 2013-04-11: qty 1

## 2013-04-11 MED ORDER — HYDROCODONE-ACETAMINOPHEN 5-325 MG PO TABS: ORAL_TABLET | ORAL | Status: DC

## 2013-04-11 MED ORDER — METHOCARBAMOL 500 MG PO TABS: 1000.0000 mg | ORAL_TABLET | Freq: Four times a day (QID) | ORAL | Status: DC

## 2013-04-11 MED ORDER — IBUPROFEN 600 MG PO TABS: 600.0000 mg | ORAL_TABLET | Freq: Four times a day (QID) | ORAL | Status: DC | PRN

## 2013-04-11 NOTE — ED Provider Notes (Signed)
CSN: [id number]     Arrival date & time 04/11/13  4098 History  This chart was scribed for Leonard Bleacher, PA, working with Toy Baker, MD by Blanchard Kelch, ED Scribe. This patient was seen in room WTR5/WTR5 and the patient's care was started at 8:20 PM.    Chief Complaint  Patient presents with  . Back Pain    HPI  HPI Comments: Leonard Silva is a 32 y.o. male who presents to the Emergency Department complaining of non-radiating, constant, worsening lower back pain that began a week ago. He reports decreased range of motion in back. Patient states that the day before the pain began he had pulled an axle off his car but didn't feel pain at the time. Patient has been taking ibuprofen, tylenol PM, and aleve which provided mild relief at first but now does not. Patient denies any previous similar pain to the area. He complains of difficulty urinating. Patient has a history of liver disease. He denies fever, weight loss, weakness in legs, numbness/tingling in groin or bowel incontinence. Patient denies any IV drug use.       History reviewed. No pertinent past medical history. History reviewed. No pertinent past surgical history. No family history on file. History  Substance Use Topics  . Smoking status: Current Every Day Smoker -- 0.50 packs/day for 10 years    Types: Cigarettes  . Smokeless tobacco: Never Used  . Alcohol Use: No    Review of Systems  Constitutional: Negative for fever and unexpected weight change.  Gastrointestinal: Negative for constipation.       Neg for fecal incontinence  Genitourinary: Negative for hematuria, flank pain and difficulty urinating.       Negative for urinary incontinence or retention  Musculoskeletal: Positive for back pain.  Neurological: Negative for weakness and numbness.       Negative for saddle paresthesias     Allergies  Review of patient's allergies indicates no known allergies.  Home Medications   Current Outpatient Rx  Name   Route  Sig  Dispense  Refill  . lidocaine (XYLOCAINE) 2 % solution   Oral   Take 15 mLs by mouth every 3 (three) hours as needed for pain.   100 mL   0   . oxyCODONE-acetaminophen (PERCOCET/ROXICET) 5-325 MG per tablet      1 to 2 tabs PO q6hrs  PRN for pain   15 tablet   0   . promethazine (PHENERGAN) 25 MG tablet   Oral   Take 1 tablet (25 mg total) by mouth every 6 (six) hours as needed for nausea.   12 tablet   0   . Pseudoeph-Doxylamine-DM-APAP (NYQUIL PO)   Oral   Take 120 mLs by mouth daily as needed (for congestion/feeling sick).           Triage Vitals: BP 106/63  Pulse 74  Temp(Src) 98.7 F (37.1 C) (Oral)  Resp 20  SpO2 99%  Physical Exam  Nursing note and vitals reviewed. Constitutional: He appears well-developed and well-nourished.  HENT:  Head: Normocephalic and atraumatic.  Eyes: Conjunctivae are normal.  Neck: Normal range of motion.  Abdominal: Soft. There is no tenderness. There is no CVA tenderness.  Musculoskeletal: Normal range of motion.       Cervical back: He exhibits normal range of motion, no tenderness and no bony tenderness.       Thoracic back: He exhibits tenderness. He exhibits normal range of motion and no bony tenderness.  Lumbar back: He exhibits normal range of motion, no tenderness and no bony tenderness.       Back:  No step-off noted with palpation of spine.   Neurological: He is alert. He has normal reflexes. No sensory deficit. He exhibits normal muscle tone.  5/5 strength in entire lower extremities bilaterally. No sensation deficit.   Skin: Skin is warm and dry.  Psychiatric: He has a normal mood and affect.    ED Course  Procedures (including critical care time)  DIAGNOSTIC STUDIES:  Oxygen Saturation is 99% on room air, normal by my interpretation.    COORDINATION OF CARE:  8:25 PM - Patient verbalizes understanding and agrees with treatment plan.    Labs Review Labs Reviewed - No data to  display Imaging Review No results found.  Patient seen and examined. Work-up initiated. Medications ordered.   Vital signs reviewed and are as follows: Filed Vitals:   04/11/13 1832  BP: 106/63  Pulse: 74  Temp: 98.7 F (37.1 C)  Resp: 20    No red flag s/s of low back pain. Patient was counseled on back pain precautions and told to do activity as tolerated but do not lift, push, or pull heavy objects more than 10 pounds for the next week.  Patient counseled to use ice or heat on back for no longer than 15 minutes every hour.   Patient prescribed muscle relaxer and counseled on proper use of muscle relaxant medication.    Patient prescribed narcotic pain medicine and counseled on proper use of narcotic pain medications. Counseled not to combine this medication with others containing tylenol.   Urged patient not to drink alcohol, drive, or perform any other activities that requires focus while taking either of these medications.  Patient urged to follow-up with PCP if pain does not improve with treatment and rest or if pain becomes recurrent. Urged to return with worsening severe pain, loss of bowel or bladder control, trouble walking.   The patient verbalizes understanding and agrees with the plan.     MDM   1. Back pain    Patient with back pain, likely strained when he was lifting the day prior. No neurological deficits. Patient is ambulatory. No warning symptoms of back pain including: loss of bowel or bladder control, night sweats, waking from sleep with back pain, unexplained fevers or weight loss, h/o cancer, IVDU, recent trauma. No concern for cauda equina, epidural abscess, or other serious cause of back pain. Conservative measures such as rest, ice/heat and pain medicine indicated with PCP follow-up if no improvement with conservative management.    I personally performed the services described in this documentation, which was scribed in my presence. The recorded  information has been reviewed and is accurate.    Renne Crigler, PA-C 04/11/13 2321

## 2013-04-11 NOTE — ED Notes (Signed)
Pt reports lower back pain that started one week ago. Pt denies any injury or trauma. Pt ambulatory in triage. Pt A/Ox4 and in NAD.

## 2013-04-11 NOTE — ED Notes (Signed)
Pt ambulatory to exam room with steady gait.  

## 2013-04-13 NOTE — ED Provider Notes (Signed)
Medical screening examination/treatment/procedure(s) were performed by non-physician practitioner and as supervising physician I was immediately available for consultation/collaboration.  Zamir Staples T Taccara Bushnell, MD 04/13/13 0759 

## 2013-04-25 ENCOUNTER — Emergency Department (HOSPITAL_COMMUNITY)
Admission: EM | Admit: 2013-04-25 | Discharge: 2013-04-25 | Disposition: A | Discharge status: Home / Self Care | Payer: Self-pay | Attending: Emergency Medicine | Admitting: Emergency Medicine

## 2013-04-25 ENCOUNTER — Encounter (HOSPITAL_COMMUNITY): Payer: Self-pay | Admitting: Emergency Medicine

## 2013-04-25 DIAGNOSIS — R197 Diarrhea, unspecified: Secondary | ICD-10-CM | POA: Insufficient documentation

## 2013-04-25 DIAGNOSIS — K859 Acute pancreatitis without necrosis or infection, unspecified: Secondary | ICD-10-CM | POA: Insufficient documentation

## 2013-04-25 DIAGNOSIS — F17200 Nicotine dependence, unspecified, uncomplicated: Secondary | ICD-10-CM | POA: Insufficient documentation

## 2013-04-25 DIAGNOSIS — R110 Nausea: Secondary | ICD-10-CM | POA: Insufficient documentation

## 2013-04-25 LAB — URINALYSIS W MICROSCOPIC + REFLEX CULTURE
Bilirubin Urine: NEGATIVE
Ketones, ur: NEGATIVE mg/dL
Nitrite: NEGATIVE
Protein, ur: NEGATIVE mg/dL
Urobilinogen, UA: 1 mg/dL (ref 0.0–1.0)

## 2013-04-25 LAB — CBC WITH DIFFERENTIAL/PLATELET
HCT: 45 % (ref 39.0–52.0)
Hemoglobin: 16.1 g/dL (ref 13.0–17.0)
Lymphs Abs: 3.7 10*3/uL (ref 0.7–4.0)
MCH: 30.5 pg (ref 26.0–34.0)
Monocytes Absolute: 0.8 10*3/uL (ref 0.1–1.0)
Monocytes Relative: 10 % (ref 3–12)
Neutro Abs: 3.5 10*3/uL (ref 1.7–7.7)
Neutrophils Relative %: 42 % — ABNORMAL LOW (ref 43–77)
RBC: 5.28 MIL/uL (ref 4.22–5.81)

## 2013-04-25 LAB — COMPREHENSIVE METABOLIC PANEL
Alkaline Phosphatase: 52 U/L (ref 39–117)
BUN: 13 mg/dL (ref 6–23)
Chloride: 102 mEq/L (ref 96–112)
Creatinine, Ser: 1.08 mg/dL (ref 0.50–1.35)
GFR calc Af Amer: >90 mL/min (ref >90)
GFR calc non Af Amer: 89 mL/min — ABNORMAL LOW (ref >90)
Glucose, Bld: 81 mg/dL (ref 70–99)
Potassium: 3.9 mEq/L (ref 3.5–5.1)
Total Bilirubin: 0.4 mg/dL (ref 0.3–1.2)

## 2013-04-25 LAB — LIPASE, BLOOD: Lipase: 447 U/L — ABNORMAL HIGH (ref 11–59)

## 2013-04-25 MED ORDER — OXYCODONE-ACETAMINOPHEN 5-325 MG PO TABS: 1.0000 | ORAL_TABLET | ORAL | Status: DC | PRN

## 2013-04-25 MED ORDER — MORPHINE SULFATE 4 MG/ML IJ SOLN
4.0000 mg | Freq: Once | INTRAMUSCULAR | Status: AC
  Administered 2013-04-25: 4 mg via INTRAVENOUS
  Filled 2013-04-25: qty 1

## 2013-04-25 MED ORDER — ONDANSETRON HCL 4 MG/2ML IJ SOLN
4.0000 mg | Freq: Once | INTRAMUSCULAR | Status: AC
  Administered 2013-04-25: 4 mg via INTRAVENOUS
  Filled 2013-04-25: qty 2

## 2013-04-25 MED ORDER — SODIUM CHLORIDE 0.9 % IV BOLUS (SEPSIS)
1000.0000 mL | Freq: Once | INTRAVENOUS | Status: AC
  Administered 2013-04-25: 1000 mL via INTRAVENOUS

## 2013-04-25 MED ORDER — PROMETHAZINE HCL 12.5 MG PO TABS: 12.5000 mg | ORAL_TABLET | Freq: Four times a day (QID) | ORAL | Status: DC | PRN

## 2013-04-25 NOTE — ED Notes (Signed)
Pt states bilateral lower abd pain onset 3 days ago, sharper on right side. Pt states had dx of liver disease 1 month ago, but unable to make follow-up appointment with dr. Rock Nephew had 1 episode of diarrhea this morning and started feeling nauseous earlier today.

## 2013-04-25 NOTE — Discharge Instructions (Signed)
Your blood work showed evidence of pancreatitis. You will need further follow up. Please call and follow up with gastroenterology as referred. Take pain medications as prescribed as needed. Clear liquid diet for 2 days. Follow up as soon as able. Return if worsening.    Acute Pancreatitis Acute pancreatitis is a disease in which the pancreas becomes suddenly inflamed. The pancreas is a large gland located behind your stomach. The pancreas produces enzymes that help digest food. The pancreas also releases the hormones glucagon and insulin that help regulate blood sugar. Damage to the pancreas occurs when the digestive enzymes from the pancreas are activated and begin attacking the pancreas before being released into the intestine. Most acute attacks last a couple of days and can cause serious complications. Some people become dehydrated and develop low blood pressure. In severe cases, bleeding into the pancreas can lead to shock and can be life-threatening. The lungs, heart, and kidneys may fail. CAUSES  Pancreatitis can happen to anyone. In some cases, the cause is unknown. Most cases are caused by:  Alcohol abuse.  Gallstones. Other less common causes are:  Certain medicines.  Exposure to certain chemicals.  Infection.  Damage caused by an accident (trauma).  Abdominal surgery. SYMPTOMS   Pain in the upper abdomen that may radiate to the back.  Tenderness and swelling of the abdomen.  Nausea and vomiting. DIAGNOSIS  Your caregiver will perform a physical exam. Blood and stool tests may be done to confirm the diagnosis. Imaging tests may also be done, such as X-rays, CT scans, or an ultrasound of the abdomen. TREATMENT  Treatment usually requires a stay in the hospital. Treatment may include:  Pain medicine.  Fluid replacement through an intravenous line (IV).  Placing a tube in the stomach to remove stomach contents and control vomiting.  Not eating for 3 or 4 days. This  gives your pancreas a rest, because enzymes are not being produced that can cause further damage.  Antibiotic medicines if your condition is caused by an infection.  Surgery of the pancreas or gallbladder. HOME CARE INSTRUCTIONS   Follow the diet advised by your caregiver. This may involve avoiding alcohol and decreasing the amount of fat in your diet.  Eat smaller, more frequent meals. This reduces the amount of digestive juices the pancreas produces.  Drink enough fluids to keep your urine clear or pale yellow.  Only take over-the-counter or prescription medicines as directed by your caregiver.  Avoid drinking alcohol if it caused your condition.  Do not smoke.  Get plenty of rest.  Check your blood sugar at home as directed by your caregiver.  Keep all follow-up appointments as directed by your caregiver. SEEK MEDICAL CARE IF:   You do not recover as quickly as expected.  You develop new or worsening symptoms.  You have persistent pain, weakness, or nausea.  You recover and then have another episode of pain. SEEK IMMEDIATE MEDICAL CARE IF:   You are unable to eat or keep fluids down.  Your pain becomes severe.  You have a fever or persistent symptoms for more than 2 to 3 days.  You have a fever and your symptoms suddenly get worse.  Your skin or the white part of your eyes turn yellow (jaundice).  You develop vomiting.  You feel dizzy, or you faint.  Your blood sugar is high (over 300 mg/dL). MAKE SURE YOU:   Understand these instructions.  Will watch your condition.  Will get help right away if  you are not doing well or get worse. Document Released: 07/26/2005 Document Revised: 01/25/2012 Document Reviewed: 11/04/2011 [facility] Patient Information 2014 [place], [place]. Clear Liquid Diet The clear liquid dietconsists of foods that are liquid or will become liquid at room temperature.You should be able to see through the liquid and beverages. Examples of  foods allowed on a clear liquid diet include fruit juice, broth or bouillon, gelatin, or frozen ice pops. The purpose of this diet is to provide necessary fluid, electrolytes such as sodium and potassium, and energy to keep the body functioning during times when you are not able to consume a regular diet.A clear liquid diet should not be continued for long periods of time as it is not nutritionally adequate.  REASONS FOR USING A CLEAR LIQUID DIET  In sudden onset (acute) conditions for a patient before or after surgery.  As the first step in oral feeding.  For fluid and electrolyte replacement in diarrheal diseases.  As a diet before certain medical tests are performed. ADEQUACY The clear liquid diet is adequate only in ascorbic acid, according to the Recommended Dietary Allowances of the [employer]. CHOOSING FOODS Breads and Starches  Allowed:  None are allowed.  Avoid: All are avoided. Vegetables  Allowed:  Strained tomato or vegetable juice.  Avoid: Any others. Fruit  Allowed:  Strained fruit juices and fruit drinks. Include 1 serving of citrus or vitamin C-enriched fruit juice daily.  Avoid: Any others. Meat and Meat Substitutes  Allowed:  None are allowed.  Avoid: All are avoided. Milk  Allowed:  None are allowed.  Avoid: All are avoided. Soups and Combination Foods  Allowed:  Clear bouillon, broth, or strained broth-based soups.  Avoid: Any others. Desserts and Sweets  Allowed:  Sugar, honey. High protein gelatin. Flavored gelatin, ices, or frozen ice pops that do not contain milk.  Avoid: Any others. Fats and Oils  Allowed:  None are allowed.  Avoid: All are avoided. Beverages  Allowed: Cereal beverages, coffee (regular or decaffeinated), tea, or soda at the discretion of your caregiver.  Avoid: Any others. Condiments  Allowed:  Iodized salt.  Avoid: Any others, including pepper. Supplements  Allowed:  Liquid nutrition  beverages.  Avoid: Any others that contain lactose or fiber. SAMPLE MEAL PLAN Breakfast  4 oz (120 mL) strained orange juice.   to 1 cup (125 to 250 mL) gelatin (plain or fortified).  1 cup (250 mL) beverage (coffee or tea).  Sugar, if desired. Midmorning Snack   cup (125 mL) gelatin (plain or fortified). Lunch  1 cup (250 mL) broth or consomm.  4 oz (120 mL) strained grapefruit juice.   cup (125 mL) gelatin (plain or fortified).  1 cup (250 mL) beverage (coffee or tea).  Sugar, if desired. Midafternoon Snack   cup (125 mL) fruit ice.   cup (125 mL) strained fruit juice. Dinner  1 cup (250 mL) broth or consomm.   cup (125 mL) cranberry juice.   cup (125 mL) flavored gelatin (plain or fortified).  1 cup (250 mL) beverage (coffee or tea).  Sugar, if desired. Evening Snack  4 oz (120 mL) strained apple juice (vitamin C-fortified).   cup (125 mL) flavored gelatin (plain or fortified). Document Released: 07/26/2005 Document Revised: 10/18/2011 Document Reviewed: 10/23/2010 [facility] Patient Information 2014 [place], [place].

## 2013-04-25 NOTE — ED Provider Notes (Signed)
CSN: [id number]     Arrival date & time 04/25/13  1548 History   First MD Initiated Contact with Patient 04/25/13 1606     Chief Complaint  Patient presents with  . Abdominal Pain   (Consider location/radiation/quality/duration/timing/severity/associated sxs/prior Treatment) HPI Leonard Silva is a 32 y.o. male who presents to emergency department complaining of abdominal pain. Patient states abdominal pain started 2 days ago, states mostly in the right lower quadrant. Patient states that pain is sharp, states that it feels like "someone is stabbing her with a knife." States that he has had similar pain a month ago, was seen in emergency department that time, had a CT scan which showed liver disease. Patient states he has not followed up since then, states pain has improved up until 3 days ago. Patient also reports associated nausea and loose bowel movements for the last 2 days. Patient denies any fever, chills, malaise, back pain, and vomiting. She denies any chest pain or first of breath. Patient states his pain does not radiate. He is currently not taking any medications for this pain. History reviewed. No pertinent past medical history. History reviewed. No pertinent past surgical history. History reviewed. No pertinent family history. History  Substance Use Topics  . Smoking status: Current Every Day Smoker -- 0.50 packs/day for 10 years    Types: Cigarettes  . Smokeless tobacco: Never Used  . Alcohol Use: No    Review of Systems  Constitutional: Negative for fever and chills.  HENT: Negative for neck pain and neck stiffness.   Respiratory: Negative for cough, chest tightness and shortness of breath.   Cardiovascular: Negative for chest pain, palpitations and leg swelling.  Gastrointestinal: Positive for nausea, abdominal pain and diarrhea. Negative for vomiting and abdominal distention.  Genitourinary: Negative for dysuria, urgency, frequency and hematuria.  Skin: Negative for rash.   Allergic/Immunologic: Negative for immunocompromised state.  Neurological: Negative for dizziness, weakness, light-headedness, numbness and headaches.    Allergies  Vicodin  Home Medications   Current Outpatient Rx  Name  Route  Sig  Dispense  Refill  . diphenhydramine-acetaminophen (TYLENOL PM) 25-500 MG TABS   Oral   Take 1 tablet by mouth at bedtime as needed.         Marland Kitchen ibuprofen (ADVIL,MOTRIN) 600 MG tablet   Oral   Take 600 mg by mouth every 6 (six) hours as needed for pain.          BP 134/81  Pulse 79  Temp(Src) 98.6 F (37 C) (Oral)  Resp 20  SpO2 100% Physical Exam  Nursing note and vitals reviewed. Constitutional: He appears well-developed and well-nourished. No distress.  HENT:  Head: Normocephalic and atraumatic.  Eyes: Conjunctivae are normal.  Neck: Neck supple.  Cardiovascular: Normal rate, regular rhythm and normal heart sounds.   Pulmonary/Chest: Effort normal. No respiratory distress. He has no wheezes. He has no rales.  Abdominal: Soft. Bowel sounds are normal. He exhibits no distension. There is tenderness. There is no rebound.  RLQ tenderness  Musculoskeletal: He exhibits no edema.  Neurological: He is alert.  Skin: Skin is warm and dry.    ED Course  Procedures (including critical care time) Labs Review Labs Reviewed  CBC WITH DIFFERENTIAL - Abnormal; Notable for the following:    Neutrophils Relative % 42 (*)    All other components within normal limits  COMPREHENSIVE METABOLIC PANEL - Abnormal; Notable for the following:    GFR calc non Af Amer 89 (*)  All other components within normal limits  LIPASE, BLOOD - Abnormal; Notable for the following:    Lipase 447 (*)    All other components within normal limits  URINALYSIS W MICROSCOPIC + REFLEX CULTURE - Abnormal; Notable for the following:    Specific Gravity, Urine 1.035 (*)    Crystals CA OXALATE CRYSTALS (*)    All other components within normal limits   Imaging Review No  results found.  MDM   1. Pancreatitis     Patient with diffuse abdominal pain worse in the right lower quadrant for several months worsened 3 days ago. Patient has nausea and loose bowels, no vomiting. Patient has had no fever. Patient is nontoxic appearing he is in no distress. His lab work did show elevated lipase at 447. Patient denies being a drinker, he has recently had an ultrasound and a CT of the abdomen which both showed hemangioma of the liver however no other pathology. His gallbladder that time appeared normal. Given patient is in no distress and non-vomiting, his pain is controlled, patient will be discharged home with outpatient followup. Patient instructed to follow clear liquid diet, pain medicines will be prescribed, and antiemetics prescribed and will give a followup with gastroenterology. Patient is agreeable to plan we'll discharge home  Filed Vitals:   04/25/13 1607  BP: 134/81  Pulse: 79  Temp: 98.6 F (37 C)  TempSrc: Oral  Resp: 20  SpO2: 100%      Raymonda Pell A Gari Hartsell, PA-C 04/25/13 2330

## 2013-04-29 ENCOUNTER — Emergency Department (HOSPITAL_COMMUNITY)
Admission: EM | Admit: 2013-04-29 | Discharge: 2013-04-29 | Disposition: A | Discharge status: Home / Self Care | Payer: Self-pay | Attending: Emergency Medicine | Admitting: Emergency Medicine

## 2013-04-29 ENCOUNTER — Encounter (HOSPITAL_COMMUNITY): Payer: Self-pay | Admitting: *Deleted

## 2013-04-29 ENCOUNTER — Telehealth (HOSPITAL_COMMUNITY): Payer: Self-pay | Admitting: Emergency Medicine

## 2013-04-29 DIAGNOSIS — Z8719 Personal history of other diseases of the digestive system: Secondary | ICD-10-CM | POA: Insufficient documentation

## 2013-04-29 DIAGNOSIS — Z79899 Other long term (current) drug therapy: Secondary | ICD-10-CM | POA: Insufficient documentation

## 2013-04-29 DIAGNOSIS — R1013 Epigastric pain: Secondary | ICD-10-CM | POA: Insufficient documentation

## 2013-04-29 DIAGNOSIS — F17200 Nicotine dependence, unspecified, uncomplicated: Secondary | ICD-10-CM | POA: Insufficient documentation

## 2013-04-29 DIAGNOSIS — R109 Unspecified abdominal pain: Secondary | ICD-10-CM

## 2013-04-29 DIAGNOSIS — R1011 Right upper quadrant pain: Secondary | ICD-10-CM | POA: Insufficient documentation

## 2013-04-29 HISTORY — DX: Acute pancreatitis without necrosis or infection, unspecified: K85.90

## 2013-04-29 LAB — CBC WITH DIFFERENTIAL/PLATELET
Eosinophils Absolute: 0.2 10*3/uL (ref 0.0–0.7)
HCT: 44.4 % (ref 39.0–52.0)
Hemoglobin: 15.1 g/dL (ref 13.0–17.0)
Lymphs Abs: 3.1 10*3/uL (ref 0.7–4.0)
MCH: 29 pg (ref 26.0–34.0)
MCV: 85.2 fL (ref 78.0–100.0)
Monocytes Relative: 9 % (ref 3–12)
Neutrophils Relative %: 52 % (ref 43–77)
RBC: 5.21 MIL/uL (ref 4.22–5.81)

## 2013-04-29 LAB — URINALYSIS, ROUTINE W REFLEX MICROSCOPIC
Bilirubin Urine: NEGATIVE
Ketones, ur: NEGATIVE mg/dL
Nitrite: NEGATIVE
Protein, ur: NEGATIVE mg/dL
Urobilinogen, UA: 0.2 mg/dL (ref 0.0–1.0)

## 2013-04-29 LAB — COMPREHENSIVE METABOLIC PANEL
Alkaline Phosphatase: 53 U/L (ref 39–117)
BUN: 8 mg/dL (ref 6–23)
Chloride: 100 mEq/L (ref 96–112)
GFR calc Af Amer: >90 mL/min (ref >90)
Glucose, Bld: 85 mg/dL (ref 70–99)
Potassium: 4.1 mEq/L (ref 3.5–5.1)
Total Bilirubin: 0.4 mg/dL (ref 0.3–1.2)
Total Protein: 7.3 g/dL (ref 6.0–8.3)

## 2013-04-29 LAB — LIPASE, BLOOD: Lipase: 44 U/L (ref 11–59)

## 2013-04-29 MED ORDER — OXYCODONE-ACETAMINOPHEN 5-325 MG PO TABS: 1.0000 | ORAL_TABLET | ORAL | Status: DC | PRN

## 2013-04-29 MED ORDER — ONDANSETRON 8 MG PO TBDP: 8.0000 mg | ORAL_TABLET | Freq: Three times a day (TID) | ORAL | Status: DC | PRN

## 2013-04-29 MED ORDER — OXYCODONE-ACETAMINOPHEN 5-325 MG PO TABS
1.0000 | ORAL_TABLET | Freq: Once | ORAL | Status: AC
  Administered 2013-04-29: 1 via ORAL
  Filled 2013-04-29: qty 1

## 2013-04-29 MED ORDER — FAMOTIDINE IN NACL 20-0.9 MG/50ML-% IV SOLN
20.0000 mg | Freq: Once | INTRAVENOUS | Status: AC
  Administered 2013-04-29: 20 mg via INTRAVENOUS
  Filled 2013-04-29: qty 50

## 2013-04-29 NOTE — ED Provider Notes (Signed)
CSN: [id number]     Arrival date & time 04/29/13  1251 History   First MD Initiated Contact with Patient 04/29/13 1329     Chief Complaint  Patient presents with  . Abdominal Pain   (Consider location/radiation/quality/duration/timing/severity/associated sxs/prior Treatment) HPI Comments: PT comes in to the ED with cc of RLQ pain. State states that the pain has been present for few days now, today he appreciated some pain around his groin, and decided to come to the ED. There is nn/v/f/c. Pt has no UTI like sx, no trauma, and no penile discharge or rash. Also reports having an episode of bloody stools. No hx of liver dz, does have hemangioma. No risk factors for gastritis.  Patient is a 32 y.o. male presenting with abdominal pain. The history is provided by the patient.  Abdominal Pain Associated symptoms: no chest pain, no cough, no diarrhea, no dysuria, no nausea, no shortness of breath and no vomiting     Past Medical History  Diagnosis Date  . Pancreatitis    History reviewed. No pertinent past surgical history. No family history on file. History  Substance Use Topics  . Smoking status: Current Every Day Smoker -- 0.50 packs/day for 10 years    Types: Cigarettes  . Smokeless tobacco: Never Used  . Alcohol Use: No    Review of Systems  Constitutional: Negative for activity change and appetite change.  Respiratory: Negative for cough and shortness of breath.   Cardiovascular: Negative for chest pain.  Gastrointestinal: Positive for abdominal pain. Negative for nausea, vomiting and diarrhea.  Genitourinary: Negative for dysuria, penile swelling, scrotal swelling and testicular pain.    Allergies  Vicodin  Home Medications   Current Outpatient Rx  Name  Route  Sig  Dispense  Refill  . diphenhydramine-acetaminophen (TYLENOL PM) 25-500 MG TABS   Oral   Take 1 tablet by mouth at bedtime as needed.         Marland Kitchen ibuprofen (ADVIL,MOTRIN) 600 MG tablet   Oral   Take 600 mg  by mouth every 6 (six) hours as needed for pain.         Marland Kitchen oxyCODONE-acetaminophen (PERCOCET) 5-325 MG per tablet   Oral   Take 1 tablet by mouth every 4 (four) hours as needed for pain.   20 tablet   0   . promethazine (PHENERGAN) 12.5 MG tablet   Oral   Take 1 tablet (12.5 mg total) by mouth every 6 (six) hours as needed for nausea.   12 tablet   0   . ondansetron (ZOFRAN ODT) 8 MG disintegrating tablet   Oral   Take 1 tablet (8 mg total) by mouth every 8 (eight) hours as needed for nausea.   20 tablet   0   . oxyCODONE-acetaminophen (PERCOCET/ROXICET) 5-325 MG per tablet   Oral   Take 1 tablet by mouth every 4 (four) hours as needed for pain.   8 tablet   0    BP 116/97  Pulse 91  Temp(Src) 98 F (36.7 C) (Oral)  Resp 18  SpO2 98% Physical Exam  Constitutional: He is oriented to person, place, and time. He appears well-developed.  HENT:  Head: Normocephalic and atraumatic.  Eyes: Conjunctivae and EOM are normal. Pupils are equal, round, and reactive to light.  Neck: Normal range of motion. Neck supple.  Cardiovascular: Normal rate and regular rhythm.   Pulmonary/Chest: Effort normal and breath sounds normal.  Abdominal: Soft. Bowel sounds are normal. He exhibits no  distension. There is tenderness. There is no rebound and no guarding.  RUQ, epigastric and RLQ tenderness. No peritoneal signs.   Genitourinary: Penis normal.  No hernia, no rash, no discharge, no testicular mass or tenderness to palpation of the testicles.  Neurological: He is alert and oriented to person, place, and time.  Skin: Skin is warm.    ED Course  Procedures (including critical care time) Labs Review Labs Reviewed  CBC WITH DIFFERENTIAL  COMPREHENSIVE METABOLIC PANEL  LIPASE, BLOOD  URINALYSIS, ROUTINE W REFLEX MICROSCOPIC  OCCULT BLOOD, POC DEVICE   Imaging Review No results found.  MDM   1. Abdominal pain    Pt comes in with cc of abd pain, groin pain, bloody stools x  1.  Pt has hx of liver hemangioma, pancreatitis.  Recent imaging was pretty benign.  Exam is grossly normal. There is no indication for imaging. Scrotal exam is normal, no rash - i am not sure what is causing the groin pain - but there is no testicular torsion, mass, and no concerns for epididymitis. No exam evidence of hernia.  Guaic negative. Dont think patient has gastritis.  Basic labs are normal. Recommended f/u with GI.   Derwood Kaplan, MD 04/29/13 [phone]

## 2013-04-29 NOTE — ED Notes (Signed)
Pt here with c/o RLQ pain. Was treated here a few days ago, diagnosed with pancreatitis.  Has apt to follow up on Tuesday. States this morning he had 1 episode of dark red blood in his stool, and states it was very watery. Had not had a bowel movement in 4 days. States he took his last pain med yesterday, and is still having sharp RLQ pain. Pt concerned about the blood in his stool

## 2013-05-01 NOTE — ED Provider Notes (Signed)
Medical screening examination/treatment/procedure(s) were performed by non-physician practitioner and as supervising physician I was immediately available for consultation/collaboration.    Sebastiana Wuest R Nilson Tabora, MD 05/01/13 1547 

## 2014-08-17 ENCOUNTER — Encounter (HOSPITAL_COMMUNITY): Payer: Self-pay | Admitting: Emergency Medicine

## 2014-08-17 ENCOUNTER — Emergency Department (HOSPITAL_COMMUNITY)
Admission: EM | Admit: 2014-08-17 | Discharge: 2014-08-18 | Disposition: A | Discharge status: Home / Self Care | Payer: Self-pay | Attending: Emergency Medicine | Admitting: Emergency Medicine

## 2014-08-17 ENCOUNTER — Emergency Department (HOSPITAL_COMMUNITY): Payer: Self-pay

## 2014-08-17 DIAGNOSIS — K859 Acute pancreatitis, unspecified: Secondary | ICD-10-CM | POA: Insufficient documentation

## 2014-08-17 DIAGNOSIS — Z720 Tobacco use: Secondary | ICD-10-CM | POA: Insufficient documentation

## 2014-08-17 DIAGNOSIS — R1031 Right lower quadrant pain: Secondary | ICD-10-CM

## 2014-08-17 LAB — CBC WITH DIFFERENTIAL/PLATELET
BASOS PCT: 1 % (ref 0–1)
Basophils Absolute: 0.1 10*3/uL (ref 0.0–0.1)
EOS ABS: 0.2 10*3/uL (ref 0.0–0.7)
Eosinophils Relative: 2 % (ref 0–5)
HEMATOCRIT: 45.9 % (ref 39.0–52.0)
Hemoglobin: 15.8 g/dL (ref 13.0–17.0)
LYMPHS ABS: 3.9 10*3/uL (ref 0.7–4.0)
LYMPHS PCT: 41 % (ref 12–46)
MCH: 29.5 pg (ref 26.0–34.0)
MCHC: 34.4 g/dL (ref 30.0–36.0)
MCV: 85.8 fL (ref 78.0–100.0)
Monocytes Absolute: 0.8 10*3/uL (ref 0.1–1.0)
Monocytes Relative: 9 % (ref 3–12)
Neutro Abs: 4.5 10*3/uL (ref 1.7–7.7)
Neutrophils Relative %: 47 % (ref 43–77)
PLATELETS: 204 10*3/uL (ref 150–400)
RBC: 5.35 MIL/uL (ref 4.22–5.81)
RDW: 13.5 % (ref 11.5–15.5)
WBC: 9.4 10*3/uL (ref 4.0–10.5)

## 2014-08-17 LAB — COMPREHENSIVE METABOLIC PANEL
ALT: 25 U/L (ref 0–53)
AST: 24 U/L (ref 0–37)
Albumin: 4.4 g/dL (ref 3.5–5.2)
Alkaline Phosphatase: 62 U/L (ref 39–117)
Anion gap: 7 (ref 5–15)
BUN: 13 mg/dL (ref 6–23)
CO2: 25 mmol/L (ref 19–32)
Calcium: 9.1 mg/dL (ref 8.4–10.5)
Chloride: 105 mEq/L (ref 96–112)
Creatinine, Ser: 0.96 mg/dL (ref 0.50–1.35)
Glucose, Bld: 89 mg/dL (ref 70–99)
Potassium: 3.9 mmol/L (ref 3.5–5.1)
SODIUM: 137 mmol/L (ref 135–145)
Total Bilirubin: 0.7 mg/dL (ref 0.3–1.2)
Total Protein: 7.6 g/dL (ref 6.0–8.3)

## 2014-08-17 LAB — URINALYSIS, ROUTINE W REFLEX MICROSCOPIC
BILIRUBIN URINE: NEGATIVE
GLUCOSE, UA: NEGATIVE mg/dL
HGB URINE DIPSTICK: NEGATIVE
KETONES UR: NEGATIVE mg/dL
LEUKOCYTES UA: NEGATIVE
Nitrite: NEGATIVE
Protein, ur: NEGATIVE mg/dL
SPECIFIC GRAVITY, URINE: 1.028 (ref 1.005–1.030)
UROBILINOGEN UA: 1 mg/dL (ref 0.0–1.0)
pH: 6.5 (ref 5.0–8.0)

## 2014-08-17 LAB — LIPASE, BLOOD: Lipase: 167 U/L — ABNORMAL HIGH (ref 11–59)

## 2014-08-17 MED ORDER — IOHEXOL 300 MG/ML  SOLN
50.0000 mL | Freq: Once | INTRAMUSCULAR | Status: AC | PRN
  Administered 2014-08-17: 50 mL via ORAL

## 2014-08-17 MED ORDER — MORPHINE SULFATE 4 MG/ML IJ SOLN
4.0000 mg | Freq: Once | INTRAMUSCULAR | Status: AC
  Administered 2014-08-17: 4 mg via INTRAVENOUS
  Filled 2014-08-17: qty 1

## 2014-08-17 MED ORDER — ONDANSETRON HCL 4 MG/2ML IJ SOLN
4.0000 mg | Freq: Once | INTRAMUSCULAR | Status: AC
  Administered 2014-08-17: 4 mg via INTRAVENOUS
  Filled 2014-08-17: qty 2

## 2014-08-17 MED ORDER — IOHEXOL 300 MG/ML  SOLN
100.0000 mL | Freq: Once | INTRAMUSCULAR | Status: AC | PRN
  Administered 2014-08-17: 100 mL via INTRAVENOUS

## 2014-08-17 MED ORDER — SODIUM CHLORIDE 0.9 % IV BOLUS (SEPSIS)
1000.0000 mL | Freq: Once | INTRAVENOUS | Status: AC
  Administered 2014-08-17: 1000 mL via INTRAVENOUS

## 2014-08-17 NOTE — ED Notes (Signed)
Pt from home c/o RLQ abdominal pain x 2 days. Nausea present. Denies fever.

## 2014-08-17 NOTE — ED Provider Notes (Signed)
CSN: [id number]     Arrival date & time 08/17/14  1854 History   First MD Initiated Contact with Patient 08/17/14 2048     Chief Complaint  Patient presents with  . Abdominal Pain  . Nausea     (Consider location/radiation/quality/duration/timing/severity/associated sxs/prior Treatment) HPI Comments: Patient presents today with RLQ abdominal pain.  He reports that the pain has been constant for the past 2 days.  Pain is gradually worsening.  Pain does not radiate.  He took Aleve earlier today for the pain, but does not feel that it is helping.  He reports associated nausea, but no vomiting.  He also reports that his appetite is decreased.  He denies fever, chills, urinary symptoms, diarrhea, scrotal pain, or scrotal swelling.  He reports a history of Pancreatitis, but states that this pain feels different.  He denies any recent alcohol use.  No prior abdominal surgeries.    The history is provided by the patient.    Past Medical History  Diagnosis Date  . Pancreatitis    History reviewed. No pertinent past surgical history. No family history on file. History  Substance Use Topics  . Smoking status: Current Every Day Smoker -- 0.50 packs/day for 10 years    Types: Cigarettes  . Smokeless tobacco: Never Used  . Alcohol Use: No    Review of Systems  All other systems reviewed and are negative.     Allergies  Vicodin  Home Medications   Prior to Admission medications   Medication Sig Start Date End Date Taking? Authorizing Provider  diphenhydramine-acetaminophen (TYLENOL PM) 25-500 MG TABS Take 1 tablet by mouth at bedtime as needed (pain).    Yes Historical Provider, MD  ondansetron (ZOFRAN ODT) 8 MG disintegrating tablet Take 1 tablet (8 mg total) by mouth every 8 (eight) hours as needed for nausea. 04/29/13   Derwood KaplanAnkit Nanavati, MD  oxyCODONE-acetaminophen (PERCOCET) 5-325 MG per tablet Take 1 tablet by mouth every 4 (four) hours as needed for pain. 04/25/13   Tatyana A  Kirichenko, PA-C  oxyCODONE-acetaminophen (PERCOCET/ROXICET) 5-325 MG per tablet Take 1 tablet by mouth every 4 (four) hours as needed for pain. 04/29/13   Derwood KaplanAnkit Nanavati, MD  promethazine (PHENERGAN) 12.5 MG tablet Take 1 tablet (12.5 mg total) by mouth every 6 (six) hours as needed for nausea. 04/25/13   Tatyana A Kirichenko, PA-C   BP 114/76 mmHg  Pulse 89  Temp(Src) 98.4 F (36.9 C) (Oral)  Resp 18  Ht 5\' 6"  (1.676 m)  Wt 225 lb (102.059 kg)  BMI 36.33 kg/m2  SpO2 100% Physical Exam  Constitutional: He appears well-developed and well-nourished.  HENT:  Head: Normocephalic and atraumatic.  Mouth/Throat: Oropharynx is clear and moist.  Neck: Normal range of motion. Neck supple.  Cardiovascular: Normal rate, regular rhythm and normal heart sounds.   Pulmonary/Chest: Effort normal and breath sounds normal.  Abdominal: Soft. Bowel sounds are normal. He exhibits no distension and no mass. There is tenderness in the right lower quadrant. There is tenderness at McBurney's point. There is no rigidity, no rebound, no guarding and negative Murphy's sign.  Musculoskeletal: Normal range of motion.  Neurological: He is alert.  Skin: Skin is warm and dry.  Psychiatric: He has a normal mood and affect.  Nursing note and vitals reviewed.   ED Course  Procedures (including critical care time) Labs Review Labs Reviewed  LIPASE, BLOOD - Abnormal; Notable for the following:    Lipase 167 (*)    All other components  within normal limits  CBC WITH DIFFERENTIAL  COMPREHENSIVE METABOLIC PANEL  URINALYSIS, ROUTINE W REFLEX MICROSCOPIC    Imaging Review No results found.   EKG Interpretation None     12:24 AM Reassessed patient.  His pain has improved at this time.  Patient tolerating PO liquids. MDM   Final diagnoses:  RLQ abdominal pain  Patient with a history of Pancreatitis presents today with RLQ abdominal pain x 2 days.  No upper abdominal pain on exam.   Labs today unremarkable  aside from an elevated Lipase of 167.  No acute findings on CT ab/pelvis, but CT scan did show stable hepatic lesions.  Patient informed of results and referred to GI.  Pain and nausea controlled at the time of discharge.  Patient stable for discharge.  Return precautions given.      Santiago Glad, PA-C 08/20/14 2251  Gerhard Munch, MD 08/21/14 0800

## 2014-08-18 MED ORDER — PROMETHAZINE HCL 12.5 MG PO TABS: 12.5000 mg | ORAL_TABLET | Freq: Four times a day (QID) | ORAL | Status: DC | PRN

## 2014-08-18 MED ORDER — OXYCODONE-ACETAMINOPHEN 5-325 MG PO TABS
1.0000 | ORAL_TABLET | Freq: Four times a day (QID) | ORAL | Status: DC | PRN
Start: 2014-08-18 — End: 2015-01-21

## 2014-08-18 NOTE — Discharge Instructions (Signed)
Acute Pancreatitis °Acute pancreatitis is a disease in which the pancreas becomes suddenly inflamed. The pancreas is a large gland located behind your stomach. The pancreas produces enzymes that help digest food. The pancreas also releases the hormones glucagon and insulin that help regulate blood sugar. Damage to the pancreas occurs when the digestive enzymes from the pancreas are activated and begin attacking the pancreas before being released into the intestine. Most acute attacks last a couple of days and can cause serious complications. Some people become dehydrated and develop low blood pressure. In severe cases, bleeding into the pancreas can lead to shock and can be life-threatening. The lungs, heart, and kidneys may fail. °CAUSES  °Pancreatitis can happen to anyone. In some cases, the cause is unknown. Most cases are caused by: °· Alcohol abuse. °· Gallstones. °Other less common causes are: °· Certain medicines. °· Exposure to certain chemicals. °· Infection. °· Damage caused by an accident (trauma). °· Abdominal surgery. °SYMPTOMS  °· Pain in the upper abdomen that may radiate to the back. °· Tenderness and swelling of the abdomen. °· Nausea and vomiting. °DIAGNOSIS  °Your caregiver will perform a physical exam. Blood and stool tests may be done to confirm the diagnosis. Imaging tests may also be done, such as X-rays, CT scans, or an ultrasound of the abdomen. °TREATMENT  °Treatment usually requires a stay in the hospital. Treatment may include: °· Pain medicine. °· Fluid replacement through an intravenous line (IV). °· Placing a tube in the stomach to remove stomach contents and control vomiting. °· Not eating for 3 or 4 days. This gives your pancreas a rest, because enzymes are not being produced that can cause further damage. °· Antibiotic medicines if your condition is caused by an infection. °· Surgery of the pancreas or gallbladder. °HOME CARE INSTRUCTIONS  °· Follow the diet advised by your  caregiver. This may involve avoiding alcohol and decreasing the amount of fat in your diet. °· Eat smaller, more frequent meals. This reduces the amount of digestive juices the pancreas produces. °· Drink enough fluids to keep your urine clear or pale yellow. °· Only take over-the-counter or prescription medicines as directed by your caregiver. °· Avoid drinking alcohol if it caused your condition. °· Do not smoke. °· Get plenty of rest. °· Check your blood sugar at home as directed by your caregiver. °· Keep all follow-up appointments as directed by your caregiver. °SEEK MEDICAL CARE IF:  °· You do not recover as quickly as expected. °· You develop new or worsening symptoms. °· You have persistent pain, weakness, or nausea. °· You recover and then have another episode of pain. °SEEK IMMEDIATE MEDICAL CARE IF:  °· You are unable to eat or keep fluids down. °· Your pain becomes severe. °· You have a fever or persistent symptoms for more than 2 to 3 days. °· You have a fever and your symptoms suddenly get worse. °· Your skin or the white part of your eyes turn yellow (jaundice). °· You develop vomiting. °· You feel dizzy, or you faint. °· Your blood sugar is high (over 300 mg/dL). °MAKE SURE YOU:  °· Understand these instructions. °· Will watch your condition. °· Will get help right away if you are not doing well or get worse. °Document Released: 07/26/2005 Document Revised: 01/25/2012 Document Reviewed: 11/04/2011 °ExitCare® Patient Information ©2015 [employer]. This information is not intended to replace advice given to you by your health care provider. Make sure you discuss any questions you have   with your health care provider. ° °Abdominal Pain °Many things can cause abdominal pain. Usually, abdominal pain is not caused by a disease and will improve without treatment. It can often be observed and treated at home. Your health care provider will do a physical exam and possibly order blood tests and X-rays to help  determine the seriousness of your pain. However, in many cases, more time must pass before a clear cause of the pain can be found. Before that point, your health care provider may not know if you need more testing or further treatment. °HOME CARE INSTRUCTIONS  °Monitor your abdominal pain for any changes. The following actions may help to alleviate any discomfort you are experiencing: °· Only take over-the-counter or prescription medicines as directed by your health care provider. °· Do not take laxatives unless directed to do so by your health care provider. °· Try a clear liquid diet (broth, tea, or water) as directed by your health care provider. Slowly move to a bland diet as tolerated. °SEEK MEDICAL CARE IF: °· You have unexplained abdominal pain. °· You have abdominal pain associated with nausea or diarrhea. °· You have pain when you urinate or have a bowel movement. °· You experience abdominal pain that wakes you in the night. °· You have abdominal pain that is worsened or improved by eating food. °· You have abdominal pain that is worsened with eating fatty foods. °· You have a fever. °SEEK IMMEDIATE MEDICAL CARE IF:  °· Your pain does not go away within 2 hours. °· You keep throwing up (vomiting). °· Your pain is felt only in portions of the abdomen, such as the right side or the left lower portion of the abdomen. °· You pass bloody or black tarry stools. °MAKE SURE YOU: °· Understand these instructions.   °· Will watch your condition.   °· Will get help right away if you are not doing well or get worse.   °Document Released: 05/05/2005 Document Revised: 07/31/2013 Document Reviewed: 04/04/2013 °ExitCare® Patient Information ©2015 [employer]. This information is not intended to replace advice given to you by your health care provider. Make sure you discuss any questions you have with your health care provider. ° °

## 2014-11-18 ENCOUNTER — Emergency Department (HOSPITAL_COMMUNITY)
Admission: EM | Admit: 2014-11-18 | Discharge: 2014-11-19 | Disposition: A | Discharge status: Home / Self Care | Payer: Self-pay | Attending: Emergency Medicine | Admitting: Emergency Medicine

## 2014-11-18 ENCOUNTER — Encounter (HOSPITAL_COMMUNITY): Payer: Self-pay | Admitting: Emergency Medicine

## 2014-11-18 DIAGNOSIS — Z79899 Other long term (current) drug therapy: Secondary | ICD-10-CM | POA: Insufficient documentation

## 2014-11-18 DIAGNOSIS — Z720 Tobacco use: Secondary | ICD-10-CM | POA: Insufficient documentation

## 2014-11-18 DIAGNOSIS — R748 Abnormal levels of other serum enzymes: Secondary | ICD-10-CM | POA: Insufficient documentation

## 2014-11-18 DIAGNOSIS — Z8719 Personal history of other diseases of the digestive system: Secondary | ICD-10-CM | POA: Insufficient documentation

## 2014-11-18 DIAGNOSIS — R1031 Right lower quadrant pain: Secondary | ICD-10-CM | POA: Insufficient documentation

## 2014-11-18 LAB — COMPREHENSIVE METABOLIC PANEL
ALK PHOS: 63 U/L (ref 39–117)
ALT: 43 U/L (ref 0–53)
ANION GAP: 6 (ref 5–15)
AST: 30 U/L (ref 0–37)
Albumin: 4.4 g/dL (ref 3.5–5.2)
BILIRUBIN TOTAL: 0.5 mg/dL (ref 0.3–1.2)
BUN: 13 mg/dL (ref 6–23)
CHLORIDE: 105 mmol/L (ref 96–112)
CO2: 29 mmol/L (ref 19–32)
Calcium: 9.3 mg/dL (ref 8.4–10.5)
Creatinine, Ser: 1.2 mg/dL (ref 0.50–1.35)
GFR, EST AFRICAN AMERICAN: 89 mL/min — AB (ref >90)
GFR, EST NON AFRICAN AMERICAN: 77 mL/min — AB (ref >90)
GLUCOSE: 89 mg/dL (ref 70–99)
Potassium: 4.3 mmol/L (ref 3.5–5.1)
Sodium: 140 mmol/L (ref 135–145)
Total Protein: 7.7 g/dL (ref 6.0–8.3)

## 2014-11-18 LAB — URINALYSIS, ROUTINE W REFLEX MICROSCOPIC
GLUCOSE, UA: NEGATIVE mg/dL
HGB URINE DIPSTICK: NEGATIVE
Ketones, ur: NEGATIVE mg/dL
Leukocytes, UA: NEGATIVE
Nitrite: NEGATIVE
PROTEIN: NEGATIVE mg/dL
Specific Gravity, Urine: 1.034 — ABNORMAL HIGH (ref 1.005–1.030)
Urobilinogen, UA: 1 mg/dL (ref 0.0–1.0)
pH: 6 (ref 5.0–8.0)

## 2014-11-18 LAB — LIPASE, BLOOD: LIPASE: 147 U/L — AB (ref 11–59)

## 2014-11-18 LAB — CBC
HCT: 47.9 % (ref 39.0–52.0)
HEMOGLOBIN: 16.1 g/dL (ref 13.0–17.0)
MCH: 29.5 pg (ref 26.0–34.0)
MCHC: 33.6 g/dL (ref 30.0–36.0)
MCV: 87.7 fL (ref 78.0–100.0)
Platelets: 190 10*3/uL (ref 150–400)
RBC: 5.46 MIL/uL (ref 4.22–5.81)
RDW: 13.7 % (ref 11.5–15.5)
WBC: 10.8 10*3/uL — ABNORMAL HIGH (ref 4.0–10.5)

## 2014-11-18 MED ORDER — MORPHINE SULFATE 4 MG/ML IJ SOLN
4.0000 mg | Freq: Once | INTRAMUSCULAR | Status: AC
  Administered 2014-11-18: 4 mg via INTRAVENOUS
  Filled 2014-11-18: qty 1

## 2014-11-18 MED ORDER — SODIUM CHLORIDE 0.9 % IV BOLUS (SEPSIS)
1000.0000 mL | Freq: Once | INTRAVENOUS | Status: AC
Start: 2014-11-18 — End: 2014-11-19
  Administered 2014-11-18: 1000 mL via INTRAVENOUS

## 2014-11-18 MED ORDER — ONDANSETRON HCL 4 MG/2ML IJ SOLN
4.0000 mg | Freq: Once | INTRAMUSCULAR | Status: AC
  Administered 2014-11-18: 4 mg via INTRAVENOUS
  Filled 2014-11-18: qty 2

## 2014-11-18 NOTE — ED Provider Notes (Signed)
CSN: [id number]     Arrival date & time 11/18/14  2007 History   First MD Initiated Contact with Patient 11/18/14 2318     Chief Complaint  Patient presents with  . Back Pain  . Groin Pain     (Consider location/radiation/quality/duration/timing/severity/associated sxs/prior Treatment) HPI   34 year old male with history of pancreatitis who presents complaining of back pain. Patient reports acute onset of pain to his right low back, persistent, radiates to his right lower abdomen which started today while he was urinating. Pain is described as sharp and throbbing worsening with certain movement. He reported having decrease in appetite andnausea without vomiting. No fever or chills, no chest pain shortness of breath or productive cough, no dysuria, hematuria, penile discharge, bowel bladder complaint. Patient report when he had a similar pain several months ago he was diagnosed with pancreatitis. However patient denies having history of diabetes, or alcohol abuse. States he was given IV fluid and pain medication during the visit and his symptoms resolved. He still has an intact appendix. He denies any prior abdominal surgery.   Past Medical History  Diagnosis Date  . Pancreatitis    History reviewed. No pertinent past surgical history. No family history on file. History  Substance Use Topics  . Smoking status: Current Every Day Smoker -- 0.50 packs/day for 10 years    Types: Cigarettes  . Smokeless tobacco: Never Used  . Alcohol Use: No    Review of Systems  All other systems reviewed and are negative.     Allergies  Vicodin  Home Medications   Prior to Admission medications   Medication Sig Start Date End Date Taking? Authorizing Provider  FLUoxetine (PROZAC) 20 MG capsule Take 20 mg by mouth daily.   Yes Historical Provider, MD  QUEtiapine (SEROQUEL XR) 300 MG 24 hr tablet Take 300 mg by mouth at bedtime.   Yes Historical Provider, MD  ondansetron (ZOFRAN ODT) 8 MG  disintegrating tablet Take 1 tablet (8 mg total) by mouth every 8 (eight) hours as needed for nausea. Patient not taking: Reported on 11/18/2014 04/29/13   Derwood KaplanAnkit Nanavati, MD  oxyCODONE-acetaminophen (PERCOCET/ROXICET) 5-325 MG per tablet Take 1-2 tablets by mouth every 6 (six) hours as needed for severe pain. Patient not taking: Reported on 11/18/2014 08/18/14   Santiago GladHeather Laisure, PA-C  promethazine (PHENERGAN) 12.5 MG tablet Take 1 tablet (12.5 mg total) by mouth every 6 (six) hours as needed for nausea or vomiting. Patient not taking: Reported on 11/18/2014 08/18/14   Santiago GladHeather Laisure, PA-C   BP 111/68 mmHg  Pulse 73  Temp(Src) 98.1 F (36.7 C) (Oral)  Resp 16  SpO2 98% Physical Exam  Constitutional: He appears well-developed and well-nourished. No distress.  HENT:  Head: Atraumatic.  Eyes: Conjunctivae are normal.  Neck: Normal range of motion. Neck supple.  Cardiovascular: Normal rate and regular rhythm.   Pulmonary/Chest: Effort normal and breath sounds normal.  Abdominal: Soft. Bowel sounds are normal. He exhibits no distension. There is tenderness (Right lower quadrant abdominal tenderness without guarding or rebound tenderness. Negative psoas obturator sign. Negative Murphy sign.).  Genitourinary:  Chaperone present during exam, no CVA tenderness. Normal penile shaft on a circumcised penis with no testicular tenderness or scrotal swelling. No inguinal hernia noted.  Neurological: He is alert.  Skin: No rash noted.  Psychiatric: He has a normal mood and affect.    ED Course  Procedures (including critical care time)  Patient here with right low back and right lower abdominal pain.  Pain is reproducible on exam however she does not have a surgical abdomen. This could be early onset of appendicitis however patient report having similar pain like this in the past and diagnosed with pancreatitis. He is not a diabetic nor is he a heavy drinker. Will check lipase. Low suspicion for kidney  stones. We'll continue to monitor.  12:01 AM Patient does have mildly elevated lipase of 147. UA without evidence of urinary tract infection. Last mostly reassuring. Plan to provide symptomatically treatment including IV fluid, antinausea medication and pain medication.  12:11 AM Patient reports that he has an appetite. Therefore I am more reassured that this is less likely to be appendicitis.  1:06 AM Pt stable for discharge.  GI referral as needed.  Return precaution discussed.    Labs Review Labs Reviewed  URINALYSIS, ROUTINE W REFLEX MICROSCOPIC - Abnormal; Notable for the following:    Specific Gravity, Urine 1.034 (*)    Bilirubin Urine SMALL (*)    All other components within normal limits  CBC - Abnormal; Notable for the following:    WBC 10.8 (*)    All other components within normal limits  COMPREHENSIVE METABOLIC PANEL - Abnormal; Notable for the following:    GFR calc non Af Amer 77 (*)    GFR calc Af Amer 89 (*)    All other components within normal limits  LIPASE, BLOOD    Imaging Review No results found.   EKG Interpretation None      MDM   Final diagnoses:  RLQ abdominal pain  Elevated lipase    BP 111/68 mmHg  Pulse 73  Temp(Src) 98.1 F (36.7 C) (Oral)  Resp 16  SpO2 98%     Fayrene Helper, PA-C 11/19/14 0107  Elwin Mocha, MD 11/20/14 [phone]

## 2014-11-18 NOTE — ED Notes (Signed)
Pt c/o R lower back pain and R groin pain since this afternoon. Pt denies any urinary symptoms or changes. Pt sts he was seen a couple of months ago for the same pain and was told he "had an issue" with his pancreas. Pt also c/o Nausea, no vomiting. Back and groin are tender to palpation. A&Ox4 and ambulatory.

## 2014-11-18 NOTE — ED Notes (Signed)
Unable to obtain vital signs at this time due to pt sleeping in the bed with girlfriend.

## 2014-11-19 ENCOUNTER — Emergency Department (HOSPITAL_COMMUNITY)
Admission: EM | Admit: 2014-11-19 | Discharge: 2014-11-19 | Disposition: A | Discharge status: Home / Self Care | Payer: Self-pay | Attending: Emergency Medicine | Admitting: Emergency Medicine

## 2014-11-19 ENCOUNTER — Encounter (HOSPITAL_COMMUNITY): Payer: Self-pay | Admitting: Emergency Medicine

## 2014-11-19 ENCOUNTER — Emergency Department (HOSPITAL_COMMUNITY): Payer: Self-pay

## 2014-11-19 DIAGNOSIS — Z720 Tobacco use: Secondary | ICD-10-CM | POA: Insufficient documentation

## 2014-11-19 DIAGNOSIS — M545 Low back pain, unspecified: Secondary | ICD-10-CM

## 2014-11-19 DIAGNOSIS — Z79899 Other long term (current) drug therapy: Secondary | ICD-10-CM | POA: Insufficient documentation

## 2014-11-19 DIAGNOSIS — Z8719 Personal history of other diseases of the digestive system: Secondary | ICD-10-CM | POA: Insufficient documentation

## 2014-11-19 LAB — COMPREHENSIVE METABOLIC PANEL
ALBUMIN: 3.6 g/dL (ref 3.5–5.2)
ALK PHOS: 53 U/L (ref 39–117)
ALT: 35 U/L (ref 0–53)
AST: 29 U/L (ref 0–37)
Anion gap: 6 (ref 5–15)
BUN: 11 mg/dL (ref 6–23)
CHLORIDE: 105 mmol/L (ref 96–112)
CO2: 26 mmol/L (ref 19–32)
Calcium: 8.7 mg/dL (ref 8.4–10.5)
Creatinine, Ser: 1.05 mg/dL (ref 0.50–1.35)
GFR calc non Af Amer: >90 mL/min (ref >90)
GLUCOSE: 92 mg/dL (ref 70–99)
POTASSIUM: 5.4 mmol/L — AB (ref 3.5–5.1)
Sodium: 137 mmol/L (ref 135–145)
Total Bilirubin: 1.1 mg/dL (ref 0.3–1.2)
Total Protein: 6.5 g/dL (ref 6.0–8.3)

## 2014-11-19 LAB — CBC WITH DIFFERENTIAL/PLATELET
BASOS PCT: 1 % (ref 0–1)
Basophils Absolute: 0 10*3/uL (ref 0.0–0.1)
Eosinophils Absolute: 0.2 10*3/uL (ref 0.0–0.7)
Eosinophils Relative: 2 % (ref 0–5)
HCT: 43.8 % (ref 39.0–52.0)
Hemoglobin: 14.9 g/dL (ref 13.0–17.0)
LYMPHS ABS: 3 10*3/uL (ref 0.7–4.0)
Lymphocytes Relative: 37 % (ref 12–46)
MCH: 29.6 pg (ref 26.0–34.0)
MCHC: 34 g/dL (ref 30.0–36.0)
MCV: 87.1 fL (ref 78.0–100.0)
MONO ABS: 0.9 10*3/uL (ref 0.1–1.0)
Monocytes Relative: 11 % (ref 3–12)
Neutro Abs: 4 10*3/uL (ref 1.7–7.7)
Neutrophils Relative %: 49 % (ref 43–77)
Platelets: 178 10*3/uL (ref 150–400)
RBC: 5.03 MIL/uL (ref 4.22–5.81)
RDW: 13.7 % (ref 11.5–15.5)
WBC: 8.1 10*3/uL (ref 4.0–10.5)

## 2014-11-19 LAB — URINALYSIS, ROUTINE W REFLEX MICROSCOPIC
BILIRUBIN URINE: NEGATIVE
GLUCOSE, UA: NEGATIVE mg/dL
Hgb urine dipstick: NEGATIVE
Ketones, ur: NEGATIVE mg/dL
LEUKOCYTES UA: NEGATIVE
NITRITE: NEGATIVE
PH: 7.5 (ref 5.0–8.0)
Protein, ur: NEGATIVE mg/dL
SPECIFIC GRAVITY, URINE: 1.024 (ref 1.005–1.030)
Urobilinogen, UA: 1 mg/dL (ref 0.0–1.0)

## 2014-11-19 MED ORDER — ONDANSETRON HCL 4 MG PO TABS: 4.0000 mg | ORAL_TABLET | Freq: Three times a day (TID) | ORAL | Status: DC | PRN

## 2014-11-19 MED ORDER — KETOROLAC TROMETHAMINE 30 MG/ML IJ SOLN
30.0000 mg | Freq: Once | INTRAMUSCULAR | Status: AC
  Administered 2014-11-19: 30 mg via INTRAVENOUS
  Filled 2014-11-19: qty 1

## 2014-11-19 MED ORDER — HYDROMORPHONE HCL 1 MG/ML IJ SOLN
1.0000 mg | Freq: Once | INTRAMUSCULAR | Status: AC
  Administered 2014-11-19: 1 mg via INTRAVENOUS
  Filled 2014-11-19: qty 1

## 2014-11-19 MED ORDER — SODIUM CHLORIDE 0.9 % IV BOLUS (SEPSIS)
1000.0000 mL | Freq: Once | INTRAVENOUS | Status: AC
  Administered 2014-11-19: 1000 mL via INTRAVENOUS

## 2014-11-19 MED ORDER — NAPROXEN 500 MG PO TABS: 500.0000 mg | ORAL_TABLET | Freq: Two times a day (BID) | ORAL | Status: DC | PRN

## 2014-11-19 MED ORDER — ONDANSETRON HCL 4 MG/2ML IJ SOLN
4.0000 mg | Freq: Once | INTRAMUSCULAR | Status: AC
  Administered 2014-11-19: 4 mg via INTRAVENOUS
  Filled 2014-11-19: qty 2

## 2014-11-19 NOTE — Progress Notes (Signed)
  CARE MANAGEMENT ED NOTE 11/19/2014  Patient:  Leonard Silva,Leonard Silva   Account Number:  [id number]  Date Initiated:  11/19/2014  Documentation initiated by:  Edd ArbourGIBBS,KIMBERLY  Subjective/Objective Assessment:   34 yr old self pay [place] pt Pt was here last night for same pain. Pt left yesterday agitated because the he did get pain meds prior to being discharge. Pt was verbally abusive to staff during this time.c/o back pain     Subjective/Objective Assessment Detail:   no pcp as confimred by pt  Pt seen by [facility] staff as evidence by forms on bedside table Pt stated " I was half listen to what she was saying because I was in pain' When Cm entered the pt room he is lying on his stomach on the internet on his phone with male and 3 children in room Male using tablet and children interacting with one another  Pt states back pain is not new, "I 've had this before"     Action/Plan:   see notes below Encourage use of [facility], [place] [facility] and goodrx services Pt states he will use these services   Action/Plan Detail:   Anticipated DC Date:  11/19/2014     Status Recommendation to Physician:   Result of Recommendation:    Other ED Services  Consult Working Plan    DC Planning Services  Other  Outpatient Services - Pt will follow up  PCP issues  [facility] / [facility] (established/new)    Choice offered to / List presented to:            Status of service:  Completed, signed off  ED Comments:   ED Comments Detail:  CM spoke with pt who [place] [place] [place]. CM discussed and provided written information for self pay pcps, importance of pcp for f/u care, www.needymeds.org, www.goodrx.com, discounted pharmacies and other [place] resources such as [facility], [facility], affordable care act,  financial assistance, DSS and  health department  Reviewed resources for [place] self pay pcps like [facility], [facility] medicine at [address], [facility],  general medical clinics, [facility] plus others, medication resources, [facility] out patient pharmacies and housing Pt voiced understanding and appreciation of resources provided  Provided [facility] contact information  Pt seen by [facility] staff also

## 2014-11-19 NOTE — ED Notes (Signed)
Pt frustrated because he was not going to receive any more pain medication. Pt left w/o discharge papers and refused to have vital signs recorded.

## 2014-11-19 NOTE — ED Provider Notes (Signed)
CSN: [id number]     Arrival date & time 11/19/14  1008 History   First MD Initiated Contact with Patient 11/19/14 1053     Chief Complaint  Patient presents with  . Back Pain     Patient is a 34 y.o. male presenting with back pain. The history is provided by the patient. No language interpreter was used.  Back Pain  Leonard Silva presents for evaluation of right low back pain radiated into the groin. His pain started 2 days ago. Pain is described as sharp in nature. It is worse with movement. He denies any fevers, abdominal pain, dysuria. He was seen in the emergency department last night and returns today because he has continued pain. He denies any numbness, weakness, incontinence. He denies any IV drug use. He has a history of similar symptoms previously is diagnosed with pancreatitis. Symptoms are moderate in nature. Worsening.  Past Medical History  Diagnosis Date  . Pancreatitis    History reviewed. No pertinent past surgical history. History reviewed. No pertinent family history. History  Substance Use Topics  . Smoking status: Current Every Day Smoker -- 0.50 packs/day for 10 years    Types: Cigarettes  . Smokeless tobacco: Never Used  . Alcohol Use: No    Review of Systems  Musculoskeletal: Positive for back pain.  All other systems reviewed and are negative.     Allergies  Vicodin  Home Medications   Prior to Admission medications   Medication Sig Start Date End Date Taking? Authorizing Provider  FLUoxetine (PROZAC) 20 MG capsule Take 20 mg by mouth daily.   Yes Historical Provider, MD  QUEtiapine (SEROQUEL XR) 300 MG 24 hr tablet Take 300 mg by mouth at bedtime.   Yes Historical Provider, MD  naproxen (NAPROSYN) 500 MG tablet Take 1 tablet (500 mg total) by mouth 2 (two) times daily as needed for moderate pain. Patient not taking: Reported on 11/19/2014 11/19/14   Fayrene Helper, PA-C  ondansetron (ZOFRAN ODT) 8 MG disintegrating tablet Take 1 tablet (8 mg total) by mouth  every 8 (eight) hours as needed for nausea. Patient not taking: Reported on 11/18/2014 04/29/13   Derwood Kaplan, MD  ondansetron (ZOFRAN) 4 MG tablet Take 1 tablet (4 mg total) by mouth every 8 (eight) hours as needed for nausea or vomiting. 11/19/14   Fayrene Helper, PA-C  oxyCODONE-acetaminophen (PERCOCET/ROXICET) 5-325 MG per tablet Take 1-2 tablets by mouth every 6 (six) hours as needed for severe pain. Patient not taking: Reported on 11/18/2014 08/18/14   Santiago Glad, PA-C  promethazine (PHENERGAN) 12.5 MG tablet Take 1 tablet (12.5 mg total) by mouth every 6 (six) hours as needed for nausea or vomiting. Patient not taking: Reported on 11/18/2014 08/18/14   Heather Laisure, PA-C   BP 102/55 mmHg  Pulse 72  Temp(Src) 97.7 F (36.5 C) (Oral)  Resp 18  SpO2 97% Physical Exam  Constitutional: He is oriented to person, place, and time. He appears well-developed and well-nourished.  HENT:  Head: Normocephalic and atraumatic.  Cardiovascular: Normal rate and regular rhythm.   No murmur heard. Pulmonary/Chest: Effort normal and breath sounds normal. No respiratory distress.  Abdominal: Soft. There is no tenderness. There is no rebound and no guarding.  Genitourinary: Penis normal.  No testicular mass or tenderness.  2+ femoral pulses bilaterally.  No hernia.  No rash.    Musculoskeletal: He exhibits no edema or tenderness.  Neurological: He is alert and oriented to person, place, and time.  5 out  of 5 strength in bilateral lower extremities with sensation of light touch intact in bilateral lower extremities  Skin: Skin is warm and dry.  Psychiatric: He has a normal mood and affect. His behavior is normal.  Nursing note and vitals reviewed.   ED Course  Procedures (including critical care time) Labs Review Labs Reviewed  COMPREHENSIVE METABOLIC PANEL - Abnormal; Notable for the following:    Potassium 5.4 (*)    All other components within normal limits  URINALYSIS, ROUTINE W REFLEX  MICROSCOPIC  CBC WITH DIFFERENTIAL/PLATELET    Imaging Review Ct Renal Stone Study  11/19/2014   CLINICAL DATA:  34 year old male with right sided pain for 3 days. Evaluate for kidney stone. Elevated lipase.  EXAM: CT ABDOMEN AND PELVIS WITHOUT CONTRAST  TECHNIQUE: Multidetector CT imaging of the abdomen and pelvis was performed following the standard protocol without IV contrast.  COMPARISON:  Several prior exams most recent 08/17/2014.  FINDINGS: No evidence of renal or ureteral obstructing stone or evidence of hydronephrosis.  On this unenhanced examination, no findings of pancreatitis. Minimal haziness of fat planes surrounding the celiac artery unchanged from 2006 and felt to be unrelated to acute inflammatory process.  Within the anterior aspect of the medial segment of the left lobe liver, there is a 3.9 cm hypodensity which on prior CT had peripheral puddling of contrast and prior ultrasound appeared echogenic. These findings suggests this represents a hemangioma although not completely characterized with delayed contrast imaging. The previously noted peripheral right lobe of liver 1.4 cm lesion is not as well delineated on the present noncontrast exam. This also had characteristics on prior imaging suggesting this represents a hemangioma.  No extra luminal bowel inflammatory process free fluid or free air. Specifically, no inflammation surrounds the appendix or terminal ileum.  No bowel containing hernia.  No adenopathy.  Lung bases are clear.  Heart size within normal limits.  No abdominal aortic aneurysm.  Minimal Schmorl's node deformity lower lumbar spine without osseous destructive lesion.  Noncontrast filled views of the urinary bladder unremarkable.  Minimal asymmetry of the prostate gland larger on the left.  IMPRESSION: No evidence of renal or ureteral obstructing stone or evidence of hydronephrosis.  On this unenhanced examination, no findings of pancreatitis.  No extra luminal bowel  inflammatory process free fluid or free air. Specifically, no inflammation surrounds the appendix or terminal ileum.  Suggestion of hemangiomas within the liver. Please see above discussion.   Electronically Signed   By: Lacy DuverneySteven  Olson M.D.   On: 11/19/2014 13:33     EKG Interpretation None      MDM   Final diagnoses:  Acute low back pain    Patient here for evaluation of low back pain radiating into the right inguinal region. There is no evidence of hernia on exam. History and examination is not consistent with torsion. CT abdomen obtained to evaluate for obstructing stone. CT without any evidence of stone. History of presentation is not consistent with pancreatitis. Discussed with patient's PCP follow-up and return precautions. Do not feel that narcotic pain medications are warranted at time of discharge. Discussed with patient taking ibuprofen or Aleve available over-the-counter for pain.    Tilden FossaElizabeth Hawken Bielby, MD 11/19/14 [phone]

## 2014-11-19 NOTE — Discharge Instructions (Signed)
Back Pain, Adult Low back pain is very common. About 1 in 5 people have back pain.The cause of low back pain is rarely dangerous. The pain often gets better over time.About half of people with a sudden onset of back pain feel better in just 2 weeks. About 8 in 10 people feel better by 6 weeks.  CAUSES Some common causes of back pain include:  Strain of the muscles or ligaments supporting the spine.  Wear and tear (degeneration) of the spinal discs.  Arthritis.  Direct injury to the back. DIAGNOSIS Most of the time, the direct cause of low back pain is not known.However, back pain can be treated effectively even when the exact cause of the pain is unknown.Answering your caregiver's questions about your overall health and symptoms is one of the most accurate ways to make sure the cause of your pain is not dangerous. If your caregiver needs more information, he or she may order lab work or imaging tests (X-rays or MRIs).However, even if imaging tests show changes in your back, this usually does not require surgery. HOME CARE INSTRUCTIONS For many people, back pain returns.Since low back pain is rarely dangerous, it is often a condition that people can learn to manageon their own.   Remain active. It is stressful on the back to sit or stand in one place. Do not sit, drive, or stand in one place for more than 30 minutes at a time. Take short walks on level surfaces as soon as pain allows.Try to increase the length of time you walk each day.  Do not stay in bed.Resting more than 1 or 2 days can delay your recovery.  Do not avoid exercise or work.Your body is made to move.It is not dangerous to be active, even though your back may hurt.Your back will likely heal faster if you return to being active before your pain is gone.  Pay attention to your body when you bend and lift. Many people have less discomfortwhen lifting if they bend their knees, keep the load close to their bodies,and  avoid twisting. Often, the most comfortable positions are those that put less stress on your recovering back.  Find a comfortable position to sleep. Use a firm mattress and lie on your side with your knees slightly bent. If you lie on your back, put a pillow under your knees.  Only take over-the-counter or prescription medicines as directed by your caregiver. Over-the-counter medicines to reduce pain and inflammation are often the most helpful.Your caregiver may prescribe muscle relaxant drugs.These medicines help dull your pain so you can more quickly return to your normal activities and healthy exercise.  Put ice on the injured area.  Put ice in a plastic bag.  Place a towel between your skin and the bag.  Leave the ice on for 15-20 minutes, 03-04 times a day for the first 2 to 3 days. After that, ice and heat may be alternated to reduce pain and spasms.  Ask your caregiver about trying back exercises and gentle massage. This may be of some benefit.  Avoid feeling anxious or stressed.Stress increases muscle tension and can worsen back pain.It is important to recognize when you are anxious or stressed and learn ways to manage it.Exercise is a great option. SEEK MEDICAL CARE IF:  You have pain that is not relieved with rest or medicine.  You have pain that does not improve in 1 week.  You have new symptoms.  You are generally not feeling well. SEEK   IMMEDIATE MEDICAL CARE IF:   You have pain that radiates from your back into your legs.  You develop new bowel or bladder control problems.  You have unusual weakness or numbness in your arms or legs.  You develop nausea or vomiting.  You develop abdominal pain.  You feel faint. Document Released: 07/26/2005 Document Revised: 01/25/2012 Document Reviewed: 11/27/2013 Exit[employer] Patient Information 2015 [employer]. This information is not intended to replace advice given to you by your health care provider. Make sure you  discuss any questions you have with your health care provider.  

## 2014-11-19 NOTE — Discharge Instructions (Signed)
Follow up with your doctor or with GI specialist for further evaluation of your abdominal pain.   Abdominal Pain Many things can cause abdominal pain. Usually, abdominal pain is not caused by a disease and will improve without treatment. It can often be observed and treated at home. Your health care provider will do a physical exam and possibly order blood tests and X-rays to help determine the seriousness of your pain. However, in many cases, more time must pass before a clear cause of the pain can be found. Before that point, your health care provider may not know if you need more testing or further treatment. HOME CARE INSTRUCTIONS  Monitor your abdominal pain for any changes. The following actions may help to alleviate any discomfort you are experiencing:  Only take over-the-counter or prescription medicines as directed by your health care provider.  Do not take laxatives unless directed to do so by your health care provider.  Try a clear liquid diet (broth, tea, or water) as directed by your health care provider. Slowly move to a bland diet as tolerated. SEEK MEDICAL CARE IF:  You have unexplained abdominal pain.  You have abdominal pain associated with nausea or diarrhea.  You have pain when you urinate or have a bowel movement.  You experience abdominal pain that wakes you in the night.  You have abdominal pain that is worsened or improved by eating food.  You have abdominal pain that is worsened with eating fatty foods.  You have a fever. SEEK IMMEDIATE MEDICAL CARE IF:   Your pain does not go away within 2 hours.  You keep throwing up (vomiting).  Your pain is felt only in portions of the abdomen, such as the right side or the left lower portion of the abdomen.  You pass bloody or black tarry stools. MAKE SURE YOU:  Understand these instructions.   Will watch your condition.   Will get help right away if you are not doing well or get worse.  Document Released:  05/05/2005 Document Revised: 07/31/2013 Document Reviewed: 04/04/2013 [facility] Patient Information 2015 [place], [place]. This information is not intended to replace advice given to you by your health care provider. Make sure you discuss any questions you have with your health care provider.

## 2014-11-19 NOTE — ED Notes (Signed)
Pt was here last night for same pain. Pt left yesterday agitated because the he did get pain meds prior to being discharge. Pt was verbally abusive to staff during this time. Pt complains of back pain that radiates to groin.

## 2014-11-19 NOTE — ED Notes (Signed)
Bed: [id number] Expected date:  Expected time:  Means of arrival:  Comments: EMS- back pain, non-ambulatory

## 2014-11-19 NOTE — ED Notes (Signed)
Pt requesting pain medication, informed that the provider as informed staff that he would like to PO challenge him and discharge him home. PA made aware, and again stated the plan. Pt states "Take this shit out my arm, i will go buy something". IV removed, pt continues to be verbally abusive towards staff, and refusing vital signs.

## 2014-12-10 ENCOUNTER — Encounter (HOSPITAL_COMMUNITY): Payer: Self-pay | Admitting: Emergency Medicine

## 2014-12-10 ENCOUNTER — Emergency Department (HOSPITAL_COMMUNITY)
Admission: EM | Admit: 2014-12-10 | Discharge: 2014-12-10 | Disposition: A | Discharge status: Home / Self Care | Payer: Self-pay | Attending: Emergency Medicine | Admitting: Emergency Medicine

## 2014-12-10 DIAGNOSIS — M549 Dorsalgia, unspecified: Secondary | ICD-10-CM | POA: Insufficient documentation

## 2014-12-10 DIAGNOSIS — R1031 Right lower quadrant pain: Secondary | ICD-10-CM | POA: Insufficient documentation

## 2014-12-10 DIAGNOSIS — Z8719 Personal history of other diseases of the digestive system: Secondary | ICD-10-CM | POA: Insufficient documentation

## 2014-12-10 DIAGNOSIS — Z79899 Other long term (current) drug therapy: Secondary | ICD-10-CM | POA: Insufficient documentation

## 2014-12-10 DIAGNOSIS — Z720 Tobacco use: Secondary | ICD-10-CM | POA: Insufficient documentation

## 2014-12-10 LAB — URINALYSIS, ROUTINE W REFLEX MICROSCOPIC
BILIRUBIN URINE: NEGATIVE
Glucose, UA: NEGATIVE mg/dL
Hgb urine dipstick: NEGATIVE
Ketones, ur: NEGATIVE mg/dL
LEUKOCYTES UA: NEGATIVE
Nitrite: NEGATIVE
PH: 6 (ref 5.0–8.0)
Protein, ur: NEGATIVE mg/dL
SPECIFIC GRAVITY, URINE: 1.025 (ref 1.005–1.030)
Urobilinogen, UA: 1 mg/dL (ref 0.0–1.0)

## 2014-12-10 MED ORDER — IBUPROFEN 800 MG PO TABS
800.0000 mg | ORAL_TABLET | Freq: Once | ORAL | Status: AC
  Administered 2014-12-10: 800 mg via ORAL
  Filled 2014-12-10: qty 1

## 2014-12-10 NOTE — Discharge Instructions (Signed)

## 2014-12-10 NOTE — ED Notes (Signed)
Pt reports he has not urinated since 5pm. Pt c.o. Pain in R groin and R lower back. Reports earlier today he was having burning with urination. Denies penile discharge.

## 2014-12-10 NOTE — ED Notes (Signed)
Pt stable, ambulatory, states understanding of discharge instructions 

## 2014-12-10 NOTE — ED Provider Notes (Signed)
CSN: [id number]     Arrival date & time 12/10/14  0026 History  This chart was scribed for Zadie Rhineonald Lucan Riner, MD by Annye AsaAnna Dorsett, ED Scribe. This patient was seen in room A06C/A06C and the patient's care was started at 3:28 AM.    Chief Complaint  Patient presents with  . Groin Pain   Patient is a 34 y.o. male presenting with groin pain. The history is provided by the patient. No language interpreter was used.  Groin Pain This is a new problem. The current episode started more than 1 week ago. The problem occurs rarely. The problem has not changed since onset.Pertinent negatives include no chest pain, no abdominal pain and no shortness of breath. Nothing aggravates the symptoms. Nothing relieves the symptoms. He has tried nothing for the symptoms. The treatment provided no relief.  HPI Comments: Leonard Silva is a 34 y.o. male who presents to the Emergency Department complaining of two weeks of intermittent right-sided groin and right-sided lower back pain, most recent episode beginning the night of 12/08/14. He is able to urinate without difficulty; he notes slight pain to the groin when moving the right leg. [facility]/11 and 11/19/14 but was discharged without conclusive diagnosis and without pain medications. He denies fevers, vomiting, dysuria, penile pain or discharge, groin swelling, bowel or bladder incontinence. He denies recent STI exposure, prior surgery to the area, recent trauma, injury or strain.     Past Medical History  Diagnosis Date  . Pancreatitis    History reviewed. No pertinent past surgical history. No family history on file. History  Substance Use Topics  . Smoking status: Current Every Day Smoker -- 0.50 packs/day for 10 years    Types: Cigarettes  . Smokeless tobacco: Never Used  . Alcohol Use: No    Review of Systems  Constitutional: Negative for fever.  Respiratory: Negative for shortness of breath.    Cardiovascular: Negative for chest pain.  Gastrointestinal: Negative for vomiting and abdominal pain.  Genitourinary: Negative for discharge, penile swelling, scrotal swelling, difficulty urinating and penile pain.  Musculoskeletal: Positive for back pain.       Right-sided groin pain  All other systems reviewed and are negative.   Allergies  Vicodin  Home Medications   Prior to Admission medications   Medication Sig Start Date End Date Taking? Authorizing Provider  FLUoxetine (PROZAC) 20 MG capsule Take 20 mg by mouth daily.    Historical Provider, MD  naproxen (NAPROSYN) 500 MG tablet Take 1 tablet (500 mg total) by mouth 2 (two) times daily as needed for moderate pain. Patient not taking: Reported on 11/19/2014 11/19/14   Fayrene HelperBowie Tran, PA-C  ondansetron (ZOFRAN ODT) 8 MG disintegrating tablet Take 1 tablet (8 mg total) by mouth every 8 (eight) hours as needed for nausea. Patient not taking: Reported on 11/18/2014 04/29/13   Derwood KaplanAnkit Nanavati, MD  ondansetron (ZOFRAN) 4 MG tablet Take 1 tablet (4 mg total) by mouth every 8 (eight) hours as needed for nausea or vomiting. 11/19/14   Fayrene HelperBowie Tran, PA-C  oxyCODONE-acetaminophen (PERCOCET/ROXICET) 5-325 MG per tablet Take 1-2 tablets by mouth every 6 (six) hours as needed for severe pain. Patient not taking: Reported on 11/18/2014 08/18/14   Santiago GladHeather Laisure, PA-C  promethazine (PHENERGAN) 12.5 MG tablet Take 1 tablet (12.5 mg total) by mouth every 6 (six) hours as needed for nausea or vomiting. Patient not taking: Reported on 11/18/2014 08/18/14   Santiago GladHeather Laisure, PA-C  QUEtiapine (SEROQUEL XR) 300 MG 24 hr tablet Take 300 mg by mouth at bedtime.    Historical Provider, MD   BP 138/80 mmHg  Pulse 69  Temp(Src) 98.3 F (36.8 C) (Oral)  Resp 16  Ht  (1.676 m)  Wt 231 lb 6 oz (104.951 kg)  BMI 37.36 kg/m2  SpO2 97% Physical Exam  Nursing note and vitals reviewed.  CONSTITUTIONAL: Well developed/well nourished HEAD:  Normocephalic/atraumatic EYES: EOMI/PERRL ENMT: Mucous membranes moist NECK: supple no meningeal signs SPINE/BACK:entire spine nontender CV: S1/S2 noted, no murmurs/rubs/gallops noted LUNGS: Lungs are clear to auscultation bilaterally, no apparent distress ABDOMEN: soft, nontender, no rebound or guarding, bowel sounds noted throughout abdomen GU:no cva tenderness; mild tenderness to right inguinal crease, no erythema or abscess, questionable mild lymphadenopathy, no hernia, no penile discharge or lesions noted. Chaperone present.  NEURO: Pt is awake/alert/appropriate, moves all extremitiesx4.  No facial droop.   EXTREMITIES: pulses normal/equal, full ROM SKIN: warm, color normal PSYCH: no abnormalities of mood noted, alert and oriented to situation  ED Course  Procedures   DIAGNOSTIC STUDIES: Oxygen Saturation is 98% on RA, normal by my interpretation.    COORDINATION OF CARE: 3:36 AM Discussed treatment plan with pt at bedside and pt agreed to plan.  Pt well appearing Mild tenderness in right inguinal region without erythema/edema or abscess Advised need for PCP followup Denies exposure to STD Recent ED workup had negative CT abd/pelvis Labs Review Labs Reviewed  URINALYSIS, ROUTINE W REFLEX MICROSCOPIC    Medications  ibuprofen (ADVIL,MOTRIN) tablet 800 mg (800 mg Oral Given 12/10/14 0345)    MDM   Final diagnoses:  Right groin pain    Nursing notes including past medical history and social history reviewed and considered in documentation Labs/vital reviewed myself and considered during evaluation   I personally performed the services described in this documentation, which was scribed in my presence. The recorded information has been reviewed and is accurate.      Zadie Rhine, MD 12/10/14 [phone]

## 2015-01-21 ENCOUNTER — Encounter (HOSPITAL_COMMUNITY): Payer: Self-pay | Admitting: Emergency Medicine

## 2015-01-21 ENCOUNTER — Emergency Department (INDEPENDENT_AMBULATORY_CARE_PROVIDER_SITE_OTHER)
Admission: EM | Admit: 2015-01-21 | Discharge: 2015-01-21 | Disposition: A | Discharge status: Home / Self Care | Payer: Self-pay | Source: Home / Self Care | Attending: Family Medicine | Admitting: Family Medicine

## 2015-01-21 ENCOUNTER — Other Ambulatory Visit (HOSPITAL_COMMUNITY)
Admission: RE | Admit: 2015-01-21 | Discharge: 2015-01-21 | Disposition: A | Discharge status: Home / Self Care | Payer: Self-pay | Source: Ambulatory Visit | Attending: Family Medicine | Admitting: Family Medicine

## 2015-01-21 DIAGNOSIS — Z113 Encounter for screening for infections with a predominantly sexual mode of transmission: Secondary | ICD-10-CM | POA: Insufficient documentation

## 2015-01-21 DIAGNOSIS — Z202 Contact with and (suspected) exposure to infections with a predominantly sexual mode of transmission: Secondary | ICD-10-CM

## 2015-01-21 NOTE — Discharge Instructions (Signed)
Sexually Transmitted Disease A sexually transmitted disease (STD) is a disease or infection often passed to another person during sex. However, STDs can be passed through nonsexual ways. An STD can be passed through:  Spit (saliva).  Semen.  Blood.  Mucus from the vagina.  Pee (urine). HOW CAN I LESSEN MY CHANCES OF GETTING AN STD?  Use:  Latex condoms.  Water-soluble lubricants with condoms. Do not use petroleum jelly or oils.  Dental dams. These are small pieces of latex that are used as a barrier during oral sex.  Avoid having more than one sex partner.  Do not have sex with someone who has other sex partners.  Do not have sex with anyone you do not know or who is at high risk for an STD.  Avoid risky sex that can break your skin.  Do not have sex if you have open sores on your mouth or skin.  Avoid drinking too much alcohol or taking illegal drugs. Alcohol and drugs can affect your good judgment.  Avoid oral and anal sex acts.  Get shots (vaccines) for HPV and hepatitis.  If you are at risk of being infected with HIV, it is advised that you take a certain medicine daily to prevent HIV infection. This is called pre-exposure prophylaxis (PrEP). You may be at risk if:  You are a man who has sex with other men (MSM).  You are attracted to the opposite sex (heterosexual) and are having sex with more than one partner.  You take drugs with a needle.  You have sex with someone who has HIV.  Talk with your doctor about if you are at high risk of being infected with HIV. If you begin to take PrEP, get tested for HIV first. Get tested every 3 months for as long as you are taking PrEP. WHAT SHOULD I DO IF I THINK I HAVE AN STD?  See your doctor.  Tell your sex partner(s) that you have an STD. They should be tested and treated.  Do not have sex until your doctor says it is okay. WHEN SHOULD I GET HELP? Get help right away if:  You have bad belly (abdominal)  pain.  You are a man and have puffiness (swelling) or pain in your testicles.  You are a woman and have puffiness in your vagina. Document Released: 09/02/2004 Document Revised: 07/31/2013 Document Reviewed: 01/19/2013 [facility] Patient Information 2015 [place], [place]. This information is not intended to replace advice given to you by your health care provider. Make sure you discuss any questions you have with your health care provider.  Safe Sex Safe sex is about reducing the risk of giving or getting a sexually transmitted disease (STD). STDs are spread through sexual contact involving the genitals, mouth, or rectum. Some STDs can be cured and others cannot. Safe sex can also prevent unintended pregnancies.  WHAT ARE SOME SAFE SEX PRACTICES?  Limit your sexual activity to only one partner who is having sex with only you.  Talk to your partner about his or her past partners, past STDs, and drug use.  Use a condom every time you have sexual intercourse. This includes vaginal, oral, and anal sexual activity. Both females and males should wear condoms during oral sex. Only use latex or polyurethane condoms and water-based lubricants. Using petroleum-based lubricants or oils to lubricate a condom will weaken the condom and increase the chance that it will break. The condom should be in place from the beginning to the end  of sexual activity. Wearing a condom reduces, but does not completely eliminate, your risk of getting or giving an STD. STDs can be spread by contact with infected body fluids and skin. °· Get vaccinated for hepatitis B and HPV. °· Avoid alcohol and recreational drugs, which can affect your judgment. You may forget to use a condom or participate in high-risk sex. °· For females, avoid douching after sexual intercourse. Douching can spread an infection farther into the reproductive tract. °· Check your body for signs of sores, blisters, rashes, or unusual discharge. See your health care  provider if you notice any of these signs. °· Avoid sexual contact if you have symptoms of an infection or are being treated for an STD. If you or your partner has herpes, avoid sexual contact when blisters are present. Use condoms at all other times. °· If you are at risk of being infected with HIV, it is recommended that you take a prescription medicine daily to prevent HIV infection. This is called pre-exposure prophylaxis (PrEP). You are considered at risk if: °¨ You are a man who has sex with other men (MSM). °¨ You are a heterosexual man or woman who is sexually active with more than one partner. °¨ You take drugs by injection. °¨ You are sexually active with a partner who has HIV. °· Talk with your health care provider about whether you are at high risk of being infected with HIV. If you choose to begin PrEP, you should first be tested for HIV. You should then be tested every 3 months for as long as you are taking PrEP. °· See your health care provider for regular screenings, exams, and tests for other STDs. Before having sex with a new partner, each of you should be screened for STDs and should talk about the results with each other. °WHAT ARE THE BENEFITS OF SAFE SEX?  °· There is less chance of getting or giving an STD. °· You can prevent unwanted or unintended pregnancies. °· By discussing safe sex concerns with your partner, you may increase feelings of intimacy, comfort, trust, and honesty between the two of you. °Document Released: 09/02/2004 Document Revised: 12/10/2013 Document Reviewed: 01/17/2012 °ExitCare® Patient Information ©2015 [employer]. This information is not intended to replace advice given to you by your health care provider. Make sure you discuss any questions you have with your health care provider. ° °

## 2015-01-21 NOTE — ED Notes (Signed)
Pt is here today because his girlfriend told him she was here for a toothache and was given rocephin and Azithromycin to treat it.  She told him he needed to come here for STD testing.  Pt has no symptoms of an STD at this time, including pain with urination, lesions, discharge, or foul odor of his urine.

## 2015-01-21 NOTE — ED Provider Notes (Signed)
CSN: [id number]     Arrival date & time 01/21/15  1415 History   First MD Initiated Contact with Patient 01/21/15 1539     Chief Complaint  Patient presents with  . Exposure to STD   (Consider location/radiation/quality/duration/timing/severity/associated sxs/prior Treatment) HPI Comments: 34 year old male is here for an STD check. He states that his male sexual partner came to the urgent care for toothache and was treated with ceftriaxone and azithromycin. She advised that he get checked for STD. Patient states he is asymptomatic.  Patient is a 34 y.o. male presenting with STD exposure.  Exposure to STD    Past Medical History  Diagnosis Date  . Pancreatitis    History reviewed. No pertinent past surgical history. History reviewed. No pertinent family history. History  Substance Use Topics  . Smoking status: Current Every Day Smoker -- 0.50 packs/day for 10 years    Types: Cigarettes  . Smokeless tobacco: Never Used  . Alcohol Use: No    Review of Systems  Genitourinary: Negative.   All other systems reviewed and are negative.   Allergies  Vicodin  Home Medications   Prior to Admission medications   Medication Sig Start Date End Date Taking? Authorizing Provider  FLUoxetine (PROZAC) 20 MG capsule Take 20 mg by mouth daily.    Historical Provider, MD  ondansetron (ZOFRAN) 4 MG tablet Take 1 tablet (4 mg total) by mouth every 8 (eight) hours as needed for nausea or vomiting. 11/19/14   Fayrene Helper, PA-C  QUEtiapine (SEROQUEL XR) 300 MG 24 hr tablet Take 300 mg by mouth at bedtime.    Historical Provider, MD   BP 130/86 mmHg  Pulse 90  Temp(Src) 97.9 F (36.6 C) (Oral)  Resp 14  SpO2 97% Physical Exam  Constitutional: He is oriented to person, place, and time. He appears well-developed and well-nourished. No distress.  Neck: Normal range of motion. Neck supple.  Pulmonary/Chest: Effort normal. No respiratory distress.  Musculoskeletal: He exhibits no edema.   Ankle location monitoring bracelet to left ankle.  Neurological: He is alert and oriented to person, place, and time. He exhibits normal muscle tone.  Skin: Skin is warm.  Psychiatric: He has a normal mood and affect.  Nursing note and vitals reviewed.   ED Course  Procedures (including critical care time) Labs Review Labs Reviewed  URINE CYTOLOGY ANCILLARY ONLY    Imaging Review No results found.   MDM   1. Possible exposure to STD   Pt asymptomatic Urine cytology pending.    Hayden Rasmussen, NP 01/21/15 [phone]

## 2015-01-22 LAB — URINE CYTOLOGY ANCILLARY ONLY
CHLAMYDIA, DNA PROBE: NEGATIVE
Neisseria Gonorrhea: NEGATIVE
Trichomonas: NEGATIVE

## 2015-01-23 NOTE — ED Notes (Signed)
Final report of GC, chlamydia negative 

## 2015-04-05 ENCOUNTER — Emergency Department (HOSPITAL_COMMUNITY)
Admission: EM | Admit: 2015-04-05 | Discharge: 2015-04-06 | Disposition: A | Discharge status: Home / Self Care | Payer: Self-pay | Attending: Emergency Medicine | Admitting: Emergency Medicine

## 2015-04-05 ENCOUNTER — Encounter (HOSPITAL_COMMUNITY): Payer: Self-pay | Admitting: Emergency Medicine

## 2015-04-05 ENCOUNTER — Emergency Department (HOSPITAL_COMMUNITY): Payer: Self-pay

## 2015-04-05 DIAGNOSIS — Z8719 Personal history of other diseases of the digestive system: Secondary | ICD-10-CM | POA: Insufficient documentation

## 2015-04-05 DIAGNOSIS — R059 Cough, unspecified: Secondary | ICD-10-CM

## 2015-04-05 DIAGNOSIS — R05 Cough: Secondary | ICD-10-CM | POA: Insufficient documentation

## 2015-04-05 DIAGNOSIS — Z720 Tobacco use: Secondary | ICD-10-CM | POA: Insufficient documentation

## 2015-04-05 DIAGNOSIS — Z79899 Other long term (current) drug therapy: Secondary | ICD-10-CM | POA: Insufficient documentation

## 2015-04-05 DIAGNOSIS — R197 Diarrhea, unspecified: Secondary | ICD-10-CM | POA: Insufficient documentation

## 2015-04-05 LAB — BASIC METABOLIC PANEL
Anion gap: 8 (ref 5–15)
BUN: 18 mg/dL (ref 6–20)
CHLORIDE: 104 mmol/L (ref 101–111)
CO2: 25 mmol/L (ref 22–32)
CREATININE: 1.49 mg/dL — AB (ref 0.61–1.24)
Calcium: 9.4 mg/dL (ref 8.9–10.3)
GFR calc non Af Amer: 59 mL/min — ABNORMAL LOW (ref >60)
GLUCOSE: 102 mg/dL — AB (ref 65–99)
Potassium: 4.4 mmol/L (ref 3.5–5.1)
Sodium: 137 mmol/L (ref 135–145)

## 2015-04-05 LAB — URINALYSIS, ROUTINE W REFLEX MICROSCOPIC
BILIRUBIN URINE: NEGATIVE
GLUCOSE, UA: NEGATIVE mg/dL
HGB URINE DIPSTICK: NEGATIVE
Ketones, ur: NEGATIVE mg/dL
Leukocytes, UA: NEGATIVE
Nitrite: NEGATIVE
PROTEIN: NEGATIVE mg/dL
SPECIFIC GRAVITY, URINE: 1.037 — AB (ref 1.005–1.030)
UROBILINOGEN UA: 2 mg/dL — AB (ref 0.0–1.0)
pH: 6.5 (ref 5.0–8.0)

## 2015-04-05 LAB — CBC
HCT: 46.8 % (ref 39.0–52.0)
Hemoglobin: 15.8 g/dL (ref 13.0–17.0)
MCH: 29.5 pg (ref 26.0–34.0)
MCHC: 33.8 g/dL (ref 30.0–36.0)
MCV: 87.3 fL (ref 78.0–100.0)
PLATELETS: 186 10*3/uL (ref 150–400)
RBC: 5.36 MIL/uL (ref 4.22–5.81)
RDW: 13.4 % (ref 11.5–15.5)
WBC: 9.8 10*3/uL (ref 4.0–10.5)

## 2015-04-05 MED ORDER — SODIUM CHLORIDE 0.9 % IV BOLUS (SEPSIS)
1000.0000 mL | Freq: Once | INTRAVENOUS | Status: AC
  Administered 2015-04-05: 1000 mL via INTRAVENOUS

## 2015-04-05 MED ORDER — ONDANSETRON HCL 4 MG/2ML IJ SOLN
4.0000 mg | Freq: Once | INTRAMUSCULAR | Status: AC
  Administered 2015-04-05: 4 mg via INTRAVENOUS
  Filled 2015-04-05: qty 2

## 2015-04-05 MED ORDER — BENZONATATE 100 MG PO CAPS: 100.0000 mg | ORAL_CAPSULE | Freq: Three times a day (TID) | ORAL | Status: DC | PRN

## 2015-04-05 NOTE — ED Notes (Signed)
Pt arrived to the ED with a complaint of chest pain.  Pt states he has had chest pain for three days.  Pt states he alsphas had shortness of breath.  Pt states he has had a cough.  Pt states he has taken OTC cold and flu medications without relief of symptoms.

## 2015-04-05 NOTE — Discharge Instructions (Signed)
Your chest x-ray did not show any pneumonia today. He will likely have a viral process. I continue to drink plenty of fluids,  Get plenty of rest. Please see her primary care provider for follow-up. Return without fail for worsening symptoms, including vomiting unable to keep down food or fluids , fevers, or any other symptoms concerning to you.  Cough, Adult  A cough is a reflex that helps clear your throat and airways. It can help heal the body or may be a reaction to an irritated airway. A cough may only last 2 or 3 weeks (acute) or may last more than 8 weeks (chronic).  CAUSES Acute cough:  Viral or bacterial infections. Chronic cough:  Infections.  Allergies.  Asthma.  Post-nasal drip.  Smoking.  Heartburn or acid reflux.  Some medicines.  Chronic lung problems (COPD).  Cancer. SYMPTOMS   Cough.  Fever.  Chest pain.  Increased breathing rate.  High-pitched whistling sound when breathing (wheezing).  Colored mucus that you cough up (sputum). TREATMENT   A bacterial cough may be treated with antibiotic medicine.  A viral cough must run its course and will not respond to antibiotics.  Your caregiver may recommend other treatments if you have a chronic cough. HOME CARE INSTRUCTIONS   Only take over-the-counter or prescription medicines for pain, discomfort, or fever as directed by your caregiver. Use cough suppressants only as directed by your caregiver.  Use a cold steam vaporizer or humidifier in your bedroom or home to help loosen secretions.  Sleep in a semi-upright position if your cough is worse at night.  Rest as needed.  Stop smoking if you smoke. SEEK IMMEDIATE MEDICAL CARE IF:   You have pus in your sputum.  Your cough starts to worsen.  You cannot control your cough with suppressants and are losing sleep.  You begin coughing up blood.  You have difficulty breathing.  You develop pain which is getting worse or is uncontrolled with  medicine.  You have a fever. MAKE SURE YOU:   Understand these instructions.  Will watch your condition.  Will get help right away if you are not doing well or get worse. Document Released: 01/22/2011 Document Revised: 10/18/2011 Document Reviewed: 01/22/2011 [facility] Patient Information 2015 [place], [place]. This information is not intended to replace advice given to you by your health care provider. Make sure you discuss any questions you have with your health care provider.  Diarrhea Diarrhea is watery poop (stool). It can make you feel weak, tired, thirsty, or give you a dry mouth (signs of dehydration). Watery poop is a sign of another problem, most often an infection. It often lasts 2-3 days. It can last longer if it is a sign of something serious. Take care of yourself as told by your doctor. HOME CARE   Drink 1 cup (8 ounces) of fluid each time you have watery poop.  Do not drink the following fluids:  Those that contain simple sugars (fructose, glucose, galactose, lactose, sucrose, maltose).  Sports drinks.  Fruit juices.  Whole milk products.  Sodas.  Drinks with caffeine (coffee, tea, soda) or alcohol.  Oral rehydration solution may be used if the doctor says it is okay. You may make your own solution. Follow this recipe:   - teaspoon table salt.   teaspoon baking soda.   teaspoon salt substitute containing potassium chloride.  1 tablespoons sugar.  1 liter (34 ounces) of water.  Avoid the following foods:  High fiber foods, such as raw  fruits and vegetables.  Nuts, seeds, and whole grain breads and cereals.   Those that are sweetened with sugar alcohols (xylitol, sorbitol, mannitol).  Try eating the following foods:  Starchy foods, such as rice, toast, pasta, low-sugar cereal, oatmeal, baked potatoes, crackers, and bagels.  Bananas.  Applesauce.  Eat probiotic-rich foods, such as yogurt and milk products that are fermented.  Wash your hands  well after each time you have watery poop.  Only take medicine as told by your doctor.  Take a warm bath to help lessen burning or pain from having watery poop. GET HELP RIGHT AWAY IF:   You cannot drink fluids without throwing up (vomiting).  You keep throwing up.  You have blood in your poop, or your poop looks black and tarry.  You do not pee (urinate) in 6-8 hours, or there is only a small amount of very dark pee.  You have belly (abdominal) pain that gets worse or stays in the same spot (localizes).  You are weak, dizzy, confused, or light-headed.  You have a very bad headache.  Your watery poop gets worse or does not get better.  You have a fever or lasting symptoms for more than 2-3 days.  You have a fever and your symptoms suddenly get worse. MAKE SURE YOU:   Understand these instructions.  Will watch your condition.  Will get help right away if you are not doing well or get worse. Document Released: 01/12/2008 Document Revised: 12/10/2013 Document Reviewed: 04/02/2012 [facility] Patient Information 2015 [place], [place]. This information is not intended to replace advice given to you by your health care provider. Make sure you discuss any questions you have with your health care provider.

## 2015-04-05 NOTE — ED Provider Notes (Signed)
CSN: [id number]     Arrival date & time 04/05/15  2112 History   First MD Initiated Contact with Patient 04/05/15 2124     Chief Complaint  Patient presents with  . Chest Pain     (Consider location/radiation/quality/duration/timing/severity/associated sxs/prior Treatment) HPI 34 year old male who presents with cough, shortness of breath, and diarrhea. He has no major past medical history. Symptoms ongoing for 2 days, with unknown sick contacts. First developed cough with clear sputum, associated with chest tightness. Denies fever or chills. Denies congestion or runny nose, but has had sore throat. Diarrhea 3-4 x per day, non-bloody and non-melenotic. Has had ocassional non-bloody, non-bilious vomiting with this.  Denies urinary symptoms or abdominal pain.  Currently uses tobacco.     Past Medical History  Diagnosis Date  . Pancreatitis    History reviewed. No pertinent past surgical history. History reviewed. No pertinent family history. Social History  Substance Use Topics  . Smoking status: Current Every Day Smoker -- 0.50 packs/day for 10 years    Types: Cigarettes  . Smokeless tobacco: Never Used  . Alcohol Use: No    Review of Systems 10/14 systems reviewed and are negative other than those stated in the HPI   Allergies  Vicodin  Home Medications   Prior to Admission medications   Medication Sig Start Date End Date Taking? Authorizing Provider  FLUoxetine (PROZAC) 20 MG capsule Take 20 mg by mouth daily.   Yes Historical Provider, MD  Phenyleph-Doxylamine-DM-APAP (NYQUIL SEVERE COLD/FLU) 5-6.25-10-325 MG/15ML LIQD Take 30 mLs by mouth at bedtime as needed (cough).   Yes Historical Provider, MD  Pseudoephedrine-APAP-DM (DAYQUIL MULTI-SYMPTOM COLD/FLU PO) Take 30 mLs by mouth every 8 (eight) hours as needed (cough).   Yes Historical Provider, MD  QUEtiapine (SEROQUEL XR) 300 MG 24 hr tablet Take 300 mg by mouth at bedtime.   Yes Historical Provider, MD  benzonatate  (TESSALON) 100 MG capsule Take 1 capsule (100 mg total) by mouth every 8 (eight) hours as needed for cough. 04/05/15   Lavera Guise, MD  ondansetron (ZOFRAN) 4 MG tablet Take 1 tablet (4 mg total) by mouth every 8 (eight) hours as needed for nausea or vomiting. Patient not taking: Reported on 04/05/2015 11/19/14   Fayrene Helper, PA-C   BP 108/70 mmHg  Pulse 90  Temp(Src) 97.8 F (36.6 C) (Oral)  Resp 18  SpO2 98% Physical Exam Physical Exam  Nursing note and vitals reviewed. Constitutional: Well developed, well nourished, non-toxic, and in no acute distress Head: Normocephalic and atraumatic.  Mouth/Throat: Oropharynx is clear and moist.  Neck: Normal range of motion. Neck supple.  Cardiovascular: Normal rate and regular rhythm. No edema.   Pulmonary/Chest: Effort normal and breath sounds normal.  Abdominal: Soft. There is no tenderness. There is no rebound and no guarding.  Musculoskeletal: Normal range of motion.  Neurological: Alert, no facial droop, fluent speech, moves all extremities symmetrically Skin: Skin is warm and dry.  Psychiatric: Cooperative  ED Course  Procedures (including critical care time) Labs Review Labs Reviewed  BASIC METABOLIC PANEL - Abnormal; Notable for the following:    Glucose, Bld 102 (*)    Creatinine, Ser 1.49 (*)    GFR calc non Af Amer 59 (*)    All other components within normal limits  URINALYSIS, ROUTINE W REFLEX MICROSCOPIC (NOT AT [facility]) - Abnormal; Notable for the following:    Specific Gravity, Urine 1.037 (*)    Urobilinogen, UA 2.0 (*)    All  other components within normal limits  CBC    Imaging Review Dg Chest 2 View  04/05/2015   CLINICAL DATA:  Chest pain, cough, and shortness of breath. Symptoms for 2 days.  EXAM: CHEST  2 VIEW  COMPARISON:  01/04/2013  FINDINGS: The cardiomediastinal contours are normal. The lungs are clear. Pulmonary vasculature is normal. No consolidation, pleural effusion, or pneumothorax. No acute osseous  abnormalities are seen.  IMPRESSION: No acute pulmonary process.   Electronically Signed   By: Rubye Oaks M.D.   On: 04/05/2015 22:02   I have personally reviewed and evaluated these images and lab results as part of my medical decision-making.   EKG Interpretation   Date/Time:  Saturday April 05 2015 21:21:00 EDT Ventricular Rate:  84 PR Interval:  150 QRS Duration: 84 QT Interval:  355 QTC Calculation: 420 R Axis:   51 Text Interpretation:  Sinus rhythm Consider left atrial enlargement Benign  early repolarization No prior EKG for comparison Confirmed by Emberlee Sortino MD, Nylan Nevel  ([id number]) on 04/05/2015 9:27:52 PM      MDM   Final diagnoses:  Cough  Diarrhea    34 year old male who presents with cough, diarrhea, chest tightness, and generalized malaise. He is nontoxic in no acute distress on presentation. Vital signs are within normal limits. He is breathing comfortably on room air,  And speaks in full sentences. Exam including cardiopulmonary exam and abdominal exam are unremarkable. Overall clinical picture seems consistent with likely viral process. Chest x-ray shows no acute cardiopulmonary processes and basic blood work including CBC, basic, UA are unremarkable.  EKG shows normal sinus rhythm, no acute changes. Chest tightness is likely in the setting of his viral illness. He is given supportive care including IV fluids and Zofran. He feels improved. Strict return and follow-up instructions are reviewed. He expressed understanding of all discharge instructions felt comfortable to plan of care.    Lavera Guise, MD 04/05/15 2322

## 2015-07-04 ENCOUNTER — Encounter (HOSPITAL_COMMUNITY): Payer: Self-pay | Admitting: Emergency Medicine

## 2015-07-04 ENCOUNTER — Emergency Department (HOSPITAL_COMMUNITY)
Admission: EM | Admit: 2015-07-04 | Discharge: 2015-07-04 | Disposition: A | Discharge status: Home / Self Care | Payer: Self-pay | Attending: Emergency Medicine | Admitting: Emergency Medicine

## 2015-07-04 ENCOUNTER — Emergency Department (HOSPITAL_COMMUNITY): Payer: Self-pay

## 2015-07-04 DIAGNOSIS — J40 Bronchitis, not specified as acute or chronic: Secondary | ICD-10-CM

## 2015-07-04 DIAGNOSIS — Z79899 Other long term (current) drug therapy: Secondary | ICD-10-CM | POA: Insufficient documentation

## 2015-07-04 DIAGNOSIS — Z8719 Personal history of other diseases of the digestive system: Secondary | ICD-10-CM | POA: Insufficient documentation

## 2015-07-04 DIAGNOSIS — J45901 Unspecified asthma with (acute) exacerbation: Secondary | ICD-10-CM | POA: Insufficient documentation

## 2015-07-04 DIAGNOSIS — F17210 Nicotine dependence, cigarettes, uncomplicated: Secondary | ICD-10-CM | POA: Insufficient documentation

## 2015-07-04 HISTORY — DX: Unspecified asthma, uncomplicated: J45.909

## 2015-07-04 MED ORDER — PREDNISONE 20 MG PO TABS
60.0000 mg | ORAL_TABLET | Freq: Once | ORAL | Status: AC
  Administered 2015-07-04: 60 mg via ORAL
  Filled 2015-07-04: qty 3

## 2015-07-04 MED ORDER — ALBUTEROL SULFATE (2.5 MG/3ML) 0.083% IN NEBU
5.0000 mg | INHALATION_SOLUTION | Freq: Once | RESPIRATORY_TRACT | Status: AC
  Administered 2015-07-04: 5 mg via RESPIRATORY_TRACT
  Filled 2015-07-04: qty 6

## 2015-07-04 NOTE — ED Provider Notes (Signed)
CSN: [id number]     Arrival date & time 07/04/15  0543 History   First MD Initiated Contact with Patient 07/04/15 [phone]     Chief Complaint  Patient presents with  . Cough  . Shortness of Breath     (Consider location/radiation/quality/duration/timing/severity/associated sxs/prior Treatment) HPI Patient presents with concern of persistent cough, episodic posttussive emesis. Symptoms have been present for about 3 weeks, have been persistent, in spite of using Tessalon Perles, brochodilator treatments multiple times daily. No new fever. There is occasional left upper chest tingling, but no focal pain. There is also back pain associated with coughing. Today, the patient's substantial cough awoke him from sleep. Patient continues to smoke.  Smoking cessation provided, particularly in light of this patient's evaluation in the ED.   Past Medical History  Diagnosis Date  . Pancreatitis   . Asthma    History reviewed. No pertinent past surgical history. History reviewed. No pertinent family history. Social History  Substance Use Topics  . Smoking status: Current Every Day Smoker -- 0.50 packs/day for 10 years    Types: Cigarettes  . Smokeless tobacco: Never Used  . Alcohol Use: No    Review of Systems  Constitutional:       Per HPI, otherwise negative  HENT:       Per HPI, otherwise negative  Respiratory:       Per HPI, otherwise negative  Cardiovascular:       Per HPI, otherwise negative  Gastrointestinal: Negative for vomiting.  Endocrine:       Negative aside from HPI  Genitourinary:       Neg aside from HPI   Musculoskeletal:       Per HPI, otherwise negative  Skin: Negative.   Neurological: Negative for syncope.      Allergies  Vicodin  Home Medications   Prior to Admission medications   Medication Sig Start Date End Date Taking? Authorizing Provider  guaiFENesin (ROBITUSSIN) 100 MG/5ML liquid Take 200 mg by mouth 3 (three) times daily as needed for  cough.   Yes Historical Provider, MD  Phenyleph-Doxylamine-DM-APAP (NYQUIL SEVERE COLD/FLU) 5-6.25-10-325 MG/15ML LIQD Take 30 mLs by mouth at bedtime as needed (cough).   Yes Historical Provider, MD  Pseudoephedrine-APAP-DM (DAYQUIL MULTI-SYMPTOM COLD/FLU PO) Take 30 mLs by mouth every 8 (eight) hours as needed (cough).   Yes Historical Provider, MD  pseudoephedrine-guaifenesin (MUCINEX D) 60-600 MG 12 hr tablet Take 2 tablets by mouth every 12 (twelve) hours as needed for congestion.   Yes Historical Provider, MD  QUEtiapine (SEROQUEL XR) 300 MG 24 hr tablet Take 300 mg by mouth at bedtime.   Yes Historical Provider, MD  benzonatate (TESSALON) 100 MG capsule Take 1 capsule (100 mg total) by mouth every 8 (eight) hours as needed for cough. 04/05/15   Lavera Guise, MD  ondansetron (ZOFRAN) 4 MG tablet Take 1 tablet (4 mg total) by mouth every 8 (eight) hours as needed for nausea or vomiting. Patient not taking: Reported on 04/05/2015 11/19/14   Fayrene Helper, PA-C   BP 108/79 mmHg  Pulse 66  Temp(Src) 97.8 F (36.6 C) (Oral)  Resp 18  Ht  (1.676 m)  Wt 227 lb (102.967 kg)  BMI 36.66 kg/m2  SpO2 100% Physical Exam  Constitutional: He is oriented to person, place, and time. He appears well-developed. No distress.  HENT:  Head: Normocephalic and atraumatic.  Eyes: Conjunctivae and EOM are normal.  Cardiovascular: Normal rate and regular rhythm.   Pulmonary/Chest: Effort  normal. No stridor. No respiratory distress. He has decreased breath sounds.  Abdominal: He exhibits no distension.  Musculoskeletal: He exhibits no edema.  Neurological: He is alert and oriented to person, place, and time.  Skin: Skin is warm and dry.  Psychiatric: He has a normal mood and affect.  Nursing note and vitals reviewed.   ED Course  Procedures (including critical care time)  Imaging Review Dg Chest 2 View  07/04/2015  CLINICAL DATA:  Chest pain and shortness of breath and persistent nonproductive cough.  EXAM: CHEST  2 VIEW COMPARISON:  04/05/2015 FINDINGS: There is peribronchial thickening. No infiltrates or effusions. Heart size and vascularity are normal. No osseous abnormalities. IMPRESSION: Prominent bronchitic changes. Electronically Signed   By: Francene BoyersJames  Maxwell M.D.   On: 07/04/2015 08:13   I have personally reviewed and evaluated these images and lab results as part of my medical decision-making.  O2- 99%ra, nml   9:40 AM Patient still awaiting breathing treatment, but x-ray results discussed with him.  10:08 AM Patient felt better after albuterol session but then left prior to receiving additional evaluation, discharge instructions, follow-up instructions.   MDM  Patient presents with ongoing cough. Here the patient is afebrile, with no x-ray evidence for pneumonia, but there is evidence for bronchitis, consistent with patient's clinical history. Patient received albuterol therapy, initiation of steroids, smoking cessation counseling, was probably appropriate for discharge, but left prior to my repeat evaluation, or to receiving additional instructions, prescriptions.  Gerhard Munchobert Bianca Raneri, MD 07/04/15 [phone]

## 2015-07-04 NOTE — ED Notes (Signed)
Patient walked out. Did not wait for follow-up from admitting provider nor did he sign AVS.

## 2015-08-11 ENCOUNTER — Emergency Department (HOSPITAL_COMMUNITY)
Admission: EM | Admit: 2015-08-11 | Discharge: 2015-08-11 | Disposition: A | Discharge status: Home / Self Care | Payer: No Typology Code available for payment source | Attending: Emergency Medicine | Admitting: Emergency Medicine

## 2015-08-11 DIAGNOSIS — F17210 Nicotine dependence, cigarettes, uncomplicated: Secondary | ICD-10-CM | POA: Insufficient documentation

## 2015-08-11 DIAGNOSIS — Y9389 Activity, other specified: Secondary | ICD-10-CM | POA: Diagnosis not present

## 2015-08-11 DIAGNOSIS — S3992XA Unspecified injury of lower back, initial encounter: Secondary | ICD-10-CM | POA: Diagnosis present

## 2015-08-11 DIAGNOSIS — M545 Low back pain, unspecified: Secondary | ICD-10-CM

## 2015-08-11 DIAGNOSIS — Z8719 Personal history of other diseases of the digestive system: Secondary | ICD-10-CM | POA: Insufficient documentation

## 2015-08-11 DIAGNOSIS — Z79899 Other long term (current) drug therapy: Secondary | ICD-10-CM | POA: Insufficient documentation

## 2015-08-11 DIAGNOSIS — Y92410 Unspecified street and highway as the place of occurrence of the external cause: Secondary | ICD-10-CM | POA: Diagnosis not present

## 2015-08-11 DIAGNOSIS — J45909 Unspecified asthma, uncomplicated: Secondary | ICD-10-CM | POA: Diagnosis not present

## 2015-08-11 DIAGNOSIS — Y998 Other external cause status: Secondary | ICD-10-CM | POA: Diagnosis not present

## 2015-08-11 MED ORDER — CYCLOBENZAPRINE HCL 10 MG PO TABS
5.0000 mg | ORAL_TABLET | Freq: Once | ORAL | Status: AC
  Administered 2015-08-11: 5 mg via ORAL
  Filled 2015-08-11: qty 1

## 2015-08-11 MED ORDER — TRAMADOL HCL 50 MG PO TABS: 50.0000 mg | ORAL_TABLET | Freq: Four times a day (QID) | ORAL | Status: DC | PRN

## 2015-08-11 MED ORDER — CYCLOBENZAPRINE HCL 5 MG PO TABS: 5.0000 mg | ORAL_TABLET | Freq: Three times a day (TID) | ORAL | Status: DC

## 2015-08-11 MED ORDER — TRAMADOL HCL 50 MG PO TABS
50.0000 mg | ORAL_TABLET | Freq: Once | ORAL | Status: AC
  Administered 2015-08-11: 50 mg via ORAL
  Filled 2015-08-11: qty 1

## 2015-08-11 NOTE — Discharge Instructions (Signed)
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
## 2015-09-15 ENCOUNTER — Emergency Department (HOSPITAL_COMMUNITY)
Admission: EM | Admit: 2015-09-15 | Discharge: 2015-09-15 | Disposition: A | Discharge status: Home / Self Care | Payer: No Typology Code available for payment source

## 2016-02-26 IMAGING — CT CT RENAL STONE PROTOCOL
1 series · 14 of 21 positions shown, 19 images · non-contrast
Comparison: Several prior exams most recent 08/17/2014.

CLINICAL DATA: 33-year-old male with right sided pain for 3 days.
Evaluate for kidney stone. Elevated lipase.

EXAM:
CT ABDOMEN AND PELVIS WITHOUT CONTRAST
TECHNIQUE: Multidetector CT imaging of the abdomen and pelvis was performed
following the standard protocol without IV contrast.

[Series 4: lung · axial · 0.74mm/px · z∈[-146,-56]mm · 14 of 21 slices shown, 19 images]
[im 2/21  soft-tissue]
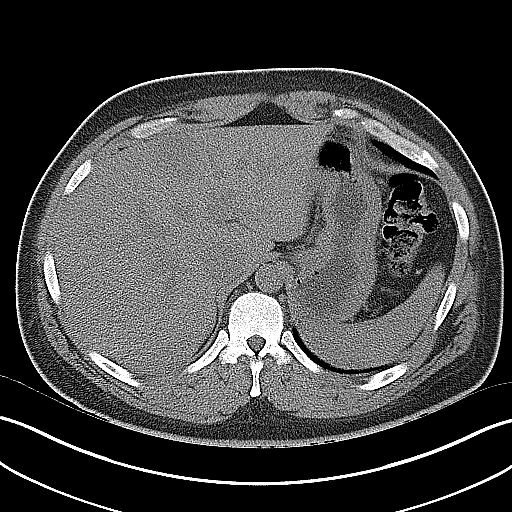
[im 2/21  bone]
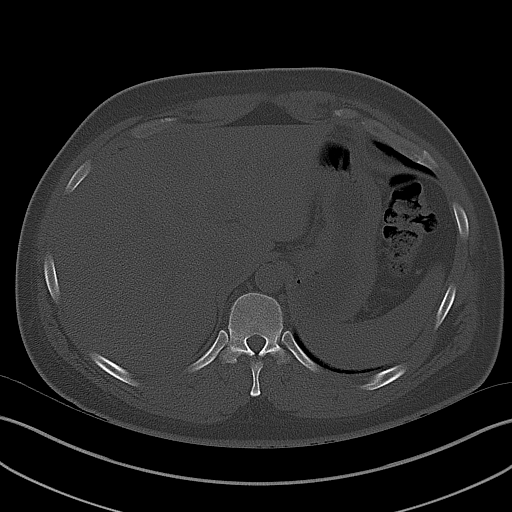
[im 4/21  soft-tissue]
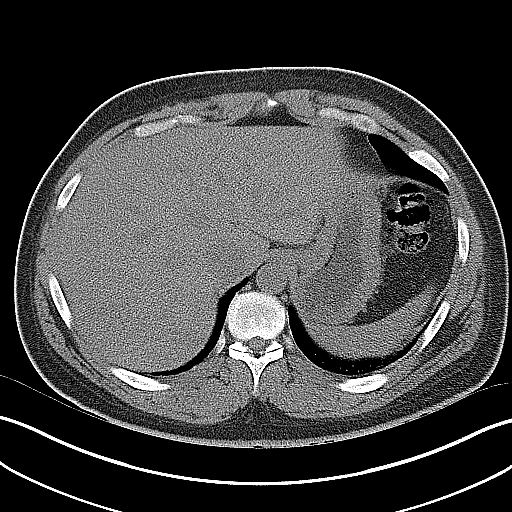
[im 5/21  soft-tissue]
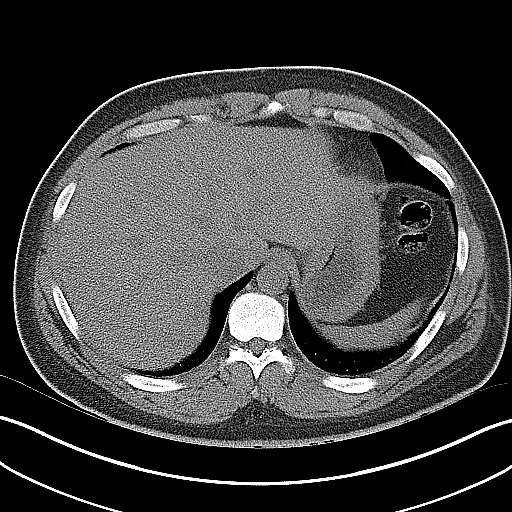
[im 7/21  soft-tissue]
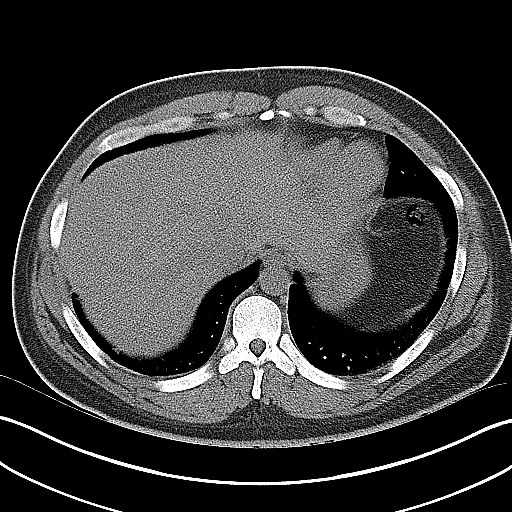
[im 8/21  soft-tissue]
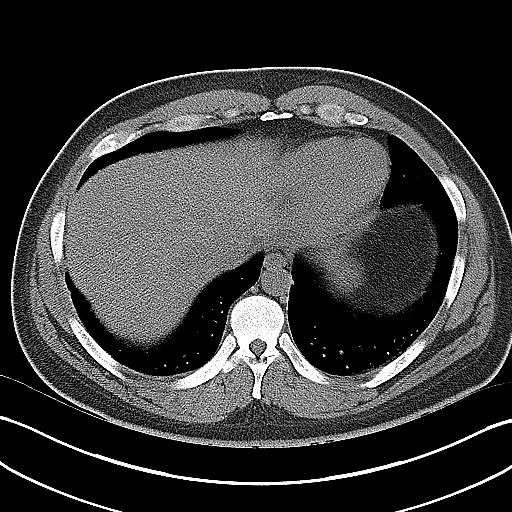
[im 10/21  soft-tissue]
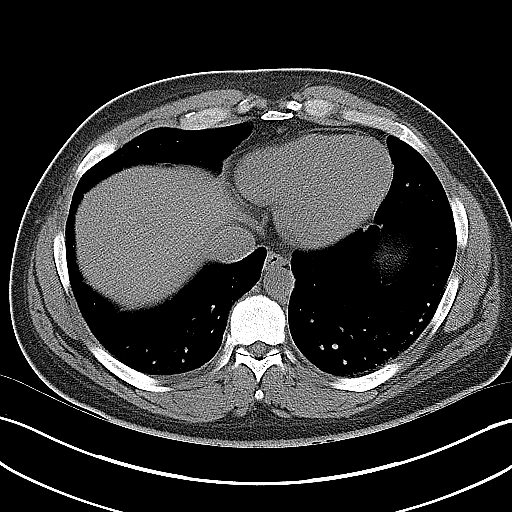
[im 11/21  soft-tissue]
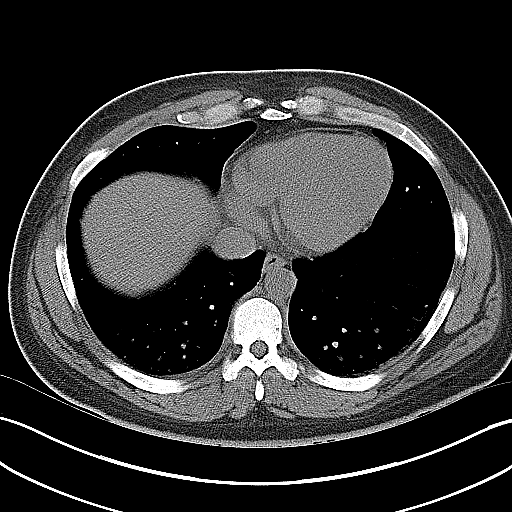
[im 12/21  soft-tissue]
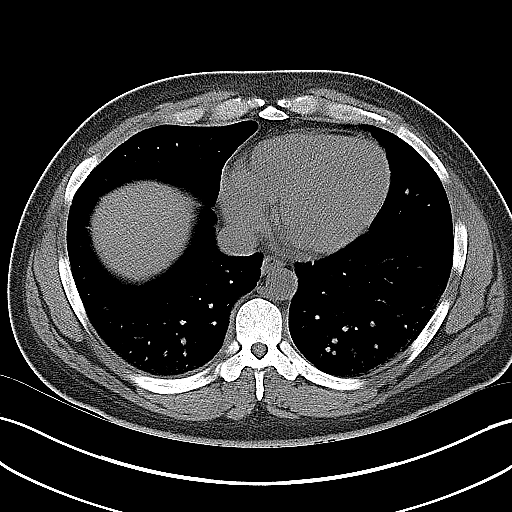
[im 14/21  soft-tissue]
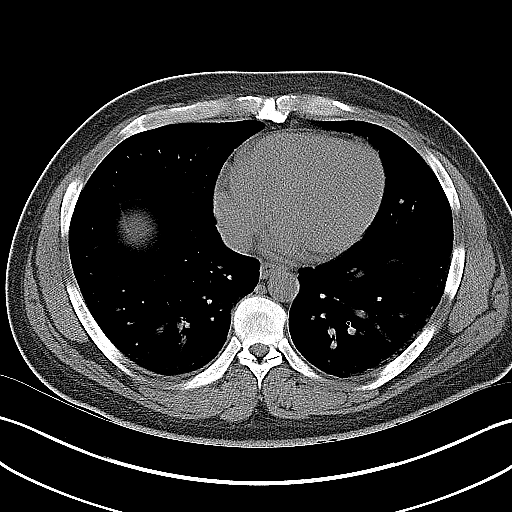
[im 14/21  bone]
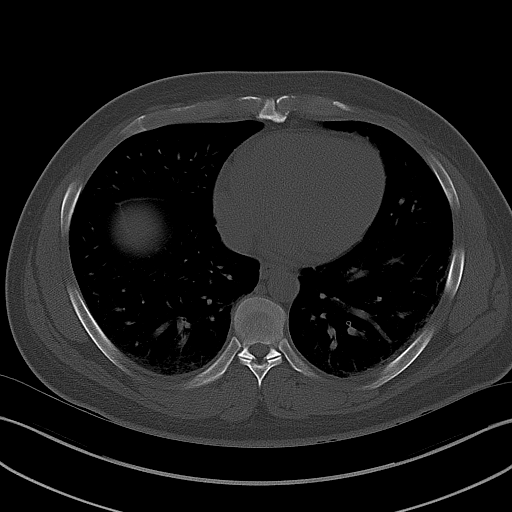
[im 15/21  soft-tissue]
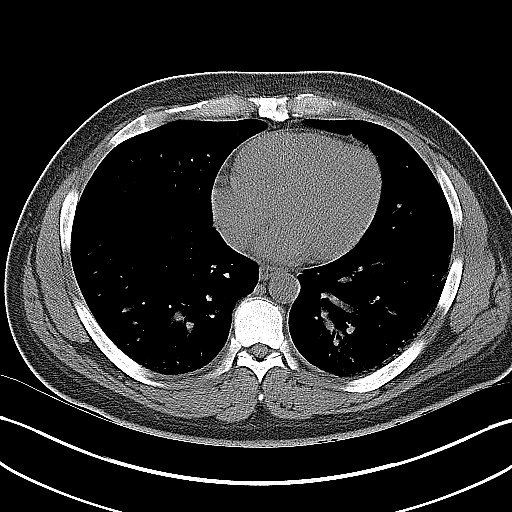
[im 17/21  soft-tissue]
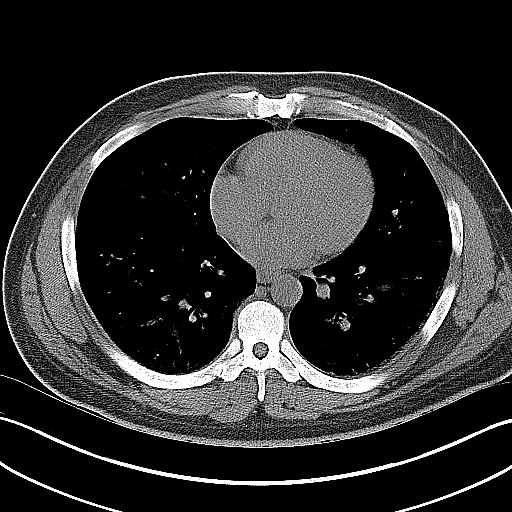
[im 17/21  lung]
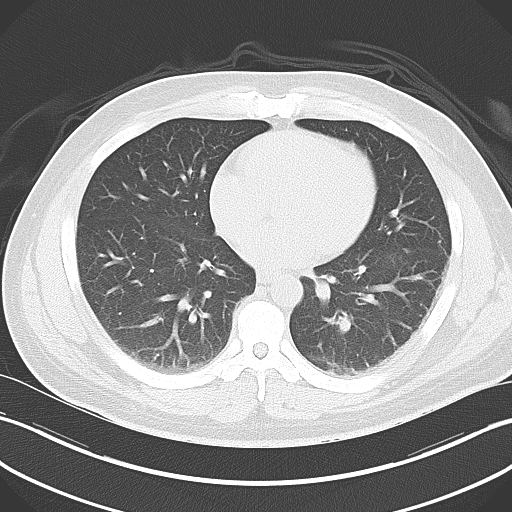
[im 18/21  soft-tissue]
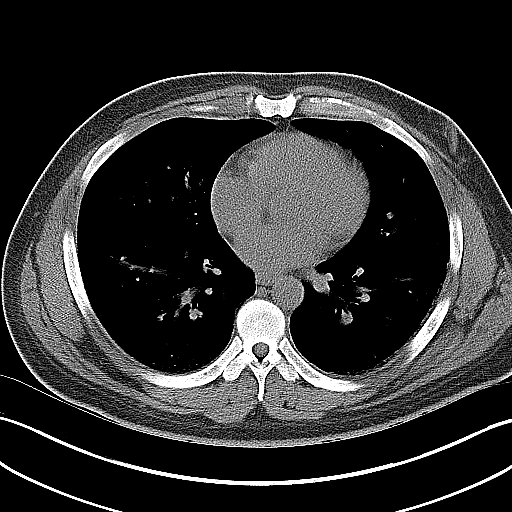
[im 18/21  lung]
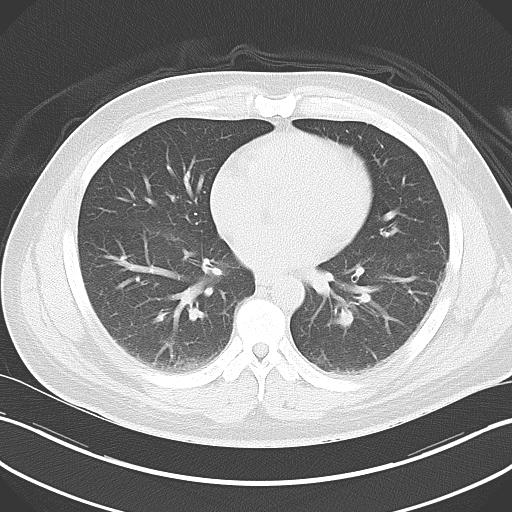
[im 19/21  lung]
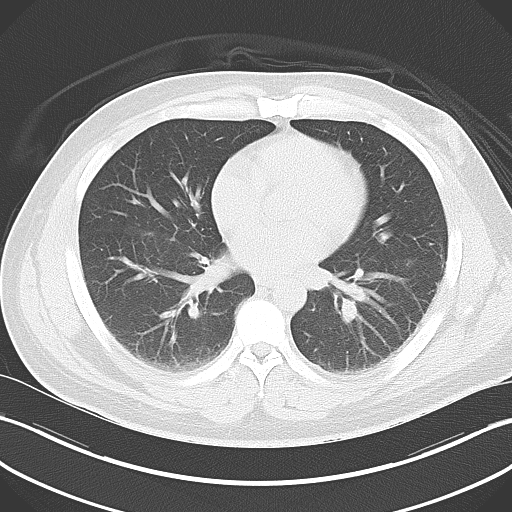
[im 20/21  soft-tissue]
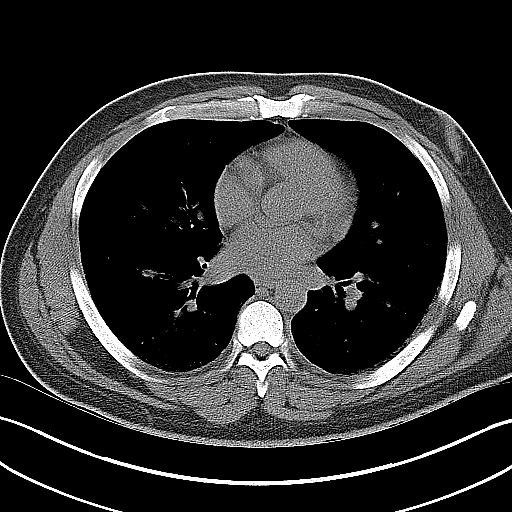
[im 20/21  lung]
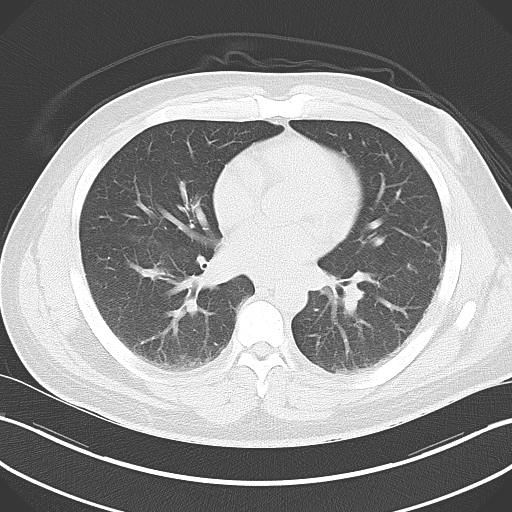

[14 of 21 positions shown; findings below may reference images not displayed]

FINDINGS: No evidence of renal or ureteral obstructing stone or evidence of
hydronephrosis.

On this unenhanced examination, no findings of pancreatitis. Minimal
haziness of fat planes surrounding the celiac artery unchanged from
5332 and felt to be unrelated to acute inflammatory process.

Within the anterior aspect of the medial segment of the left lobe
liver, there is a 3.9 cm hypodensity which on prior CT had
peripheral puddling of contrast and prior ultrasound appeared
echogenic. These findings suggests this represents a hemangioma
although not completely characterized with delayed contrast imaging.
The previously noted peripheral right lobe of liver 1.4 cm lesion is
not as well delineated on the present noncontrast exam. This also
had characteristics on prior imaging suggesting this represents a
hemangioma.

No extra luminal bowel inflammatory process free fluid or free air.
Specifically, no inflammation surrounds the appendix or terminal
ileum.

No bowel containing hernia.

No adenopathy.

Lung bases are clear.  Heart size within normal limits.

No abdominal aortic aneurysm.

Minimal Schmorl's node deformity lower lumbar spine without osseous
destructive lesion.

Noncontrast filled views of the urinary bladder unremarkable.

Minimal asymmetry of the prostate gland larger on the left.
IMPRESSION: No evidence of renal or ureteral obstructing stone or evidence of
hydronephrosis.

On this unenhanced examination, no findings of pancreatitis.

No extra luminal bowel inflammatory process free fluid or free air.
Specifically, no inflammation surrounds the appendix or terminal
ileum.

Suggestion of hemangiomas within the liver. Please see above
discussion.

## 2019-11-09 ENCOUNTER — Other Ambulatory Visit: Payer: Self-pay

## 2019-11-09 ENCOUNTER — Encounter (HOSPITAL_COMMUNITY): Payer: Self-pay | Admitting: Emergency Medicine

## 2019-11-09 ENCOUNTER — Emergency Department (HOSPITAL_COMMUNITY): Payer: Self-pay

## 2019-11-09 ENCOUNTER — Emergency Department (HOSPITAL_COMMUNITY)
Admission: EM | Admit: 2019-11-09 | Discharge: 2019-11-09 | Disposition: A | Discharge status: Home / Self Care | Payer: Self-pay | Attending: Emergency Medicine | Admitting: Emergency Medicine

## 2019-11-09 DIAGNOSIS — Y929 Unspecified place or not applicable: Secondary | ICD-10-CM | POA: Insufficient documentation

## 2019-11-09 DIAGNOSIS — S0081XA Abrasion of other part of head, initial encounter: Secondary | ICD-10-CM | POA: Insufficient documentation

## 2019-11-09 DIAGNOSIS — Y939 Activity, unspecified: Secondary | ICD-10-CM | POA: Insufficient documentation

## 2019-11-09 DIAGNOSIS — Z79899 Other long term (current) drug therapy: Secondary | ICD-10-CM | POA: Insufficient documentation

## 2019-11-09 DIAGNOSIS — F17210 Nicotine dependence, cigarettes, uncomplicated: Secondary | ICD-10-CM | POA: Insufficient documentation

## 2019-11-09 DIAGNOSIS — R55 Syncope and collapse: Secondary | ICD-10-CM | POA: Insufficient documentation

## 2019-11-09 DIAGNOSIS — R519 Headache, unspecified: Secondary | ICD-10-CM | POA: Insufficient documentation

## 2019-11-09 DIAGNOSIS — F1290 Cannabis use, unspecified, uncomplicated: Secondary | ICD-10-CM | POA: Insufficient documentation

## 2019-11-09 DIAGNOSIS — W1789XA Other fall from one level to another, initial encounter: Secondary | ICD-10-CM | POA: Insufficient documentation

## 2019-11-09 DIAGNOSIS — Y999 Unspecified external cause status: Secondary | ICD-10-CM | POA: Insufficient documentation

## 2019-11-09 DIAGNOSIS — S060X1A Concussion with loss of consciousness of 30 minutes or less, initial encounter: Secondary | ICD-10-CM | POA: Insufficient documentation

## 2019-11-09 LAB — COMPREHENSIVE METABOLIC PANEL
ALT: 76 U/L — ABNORMAL HIGH (ref 0–44)
AST: 52 U/L — ABNORMAL HIGH (ref 15–41)
Albumin: 3.8 g/dL (ref 3.5–5.0)
Alkaline Phosphatase: 60 U/L (ref 38–126)
Anion gap: 10 (ref 5–15)
BUN: 9 mg/dL (ref 6–20)
CO2: 28 mmol/L (ref 22–32)
Calcium: 9.2 mg/dL (ref 8.9–10.3)
Chloride: 102 mmol/L (ref 98–111)
Creatinine, Ser: 1.15 mg/dL (ref 0.61–1.24)
GFR calc Af Amer: >60 mL/min (ref >60)
GFR calc non Af Amer: >60 mL/min (ref >60)
Glucose, Bld: 94 mg/dL (ref 70–99)
Potassium: 3.8 mmol/L (ref 3.5–5.1)
Sodium: 140 mmol/L (ref 135–145)
Total Bilirubin: 0.7 mg/dL (ref 0.3–1.2)
Total Protein: 6.5 g/dL (ref 6.5–8.1)

## 2019-11-09 LAB — CBC WITH DIFFERENTIAL/PLATELET
Abs Immature Granulocytes: 0.39 10*3/uL — ABNORMAL HIGH (ref 0.00–0.07)
Basophils Absolute: 0.1 10*3/uL (ref 0.0–0.1)
Basophils Relative: 1 %
Eosinophils Absolute: 0.2 10*3/uL (ref 0.0–0.5)
Eosinophils Relative: 1 %
HCT: 47.5 % (ref 39.0–52.0)
Hemoglobin: 15.2 g/dL (ref 13.0–17.0)
Immature Granulocytes: 3 %
Lymphocytes Relative: 33 %
Lymphs Abs: 3.8 10*3/uL (ref 0.7–4.0)
MCH: 28.7 pg (ref 26.0–34.0)
MCHC: 32 g/dL (ref 30.0–36.0)
MCV: 89.8 fL (ref 80.0–100.0)
Monocytes Absolute: 1.2 10*3/uL — ABNORMAL HIGH (ref 0.1–1.0)
Monocytes Relative: 10 %
Neutro Abs: 6 10*3/uL (ref 1.7–7.7)
Neutrophils Relative %: 52 %
Platelets: 204 10*3/uL (ref 150–400)
RBC: 5.29 MIL/uL (ref 4.22–5.81)
RDW: 14 % (ref 11.5–15.5)
WBC: 11.6 10*3/uL — ABNORMAL HIGH (ref 4.0–10.5)
nRBC: 0 % (ref 0.0–0.2)

## 2019-11-09 LAB — URINALYSIS, ROUTINE W REFLEX MICROSCOPIC
Bilirubin Urine: NEGATIVE
Glucose, UA: NEGATIVE mg/dL
Hgb urine dipstick: NEGATIVE
Ketones, ur: NEGATIVE mg/dL
Leukocytes,Ua: NEGATIVE
Nitrite: NEGATIVE
Protein, ur: NEGATIVE mg/dL
Specific Gravity, Urine: 1.019 (ref 1.005–1.030)
pH: 6 (ref 5.0–8.0)

## 2019-11-09 LAB — RAPID URINE DRUG SCREEN, HOSP PERFORMED
Amphetamines: NOT DETECTED
Barbiturates: NOT DETECTED
Benzodiazepines: NOT DETECTED
Cocaine: NOT DETECTED
Opiates: NOT DETECTED
Tetrahydrocannabinol: POSITIVE — AB

## 2019-11-09 LAB — ETHANOL: Alcohol, Ethyl (B): <10 mg/dL (ref <10)

## 2019-11-09 LAB — TROPONIN I (HIGH SENSITIVITY): Troponin I (High Sensitivity): 4 ng/L (ref <18)

## 2019-11-09 MED ORDER — KETOROLAC TROMETHAMINE 30 MG/ML IJ SOLN
30.0000 mg | Freq: Once | INTRAMUSCULAR | Status: AC
  Administered 2019-11-09: 22:00:00 30 mg via INTRAMUSCULAR

## 2019-11-09 MED ORDER — KETOROLAC TROMETHAMINE 30 MG/ML IJ SOLN
30.0000 mg | Freq: Once | INTRAMUSCULAR | Status: DC
  Filled 2019-11-09: qty 1

## 2019-11-09 NOTE — ED Provider Notes (Signed)
[facility] EMERGENCY DEPARTMENT Provider Note   CSN: [id number] Arrival date & time: 11/09/19  2036     History Chief Complaint  Patient presents with  . Loss of Consciousness  . Head Injury    Leonard Silva is a 39 y.o. male.  HPI Level 5 caveat due to altered mental status. Patient presents after syncopal episode.  Patient's wife gone to the store and came out and patient had a decreased responsiveness in the car.  He made sort of a coughing sound and then went to open his door and fell out headfirst onto the ground.  Loss conscious with decreased responsiveness.  Abrasion to right temple and right parietal area with hematoma.  Has had a syncopal episode in the past with coughing.  Also reportedly had smoked some marijuana around the time of the passing out.  No chest pain.  Does answer questions but is somewhat slow to answer.  Patient states his head hurts.    Past Medical History:  Diagnosis Date  . Asthma   . Pancreatitis     There are no problems to display for this patient.   History reviewed. No pertinent surgical history.     History reviewed. No pertinent family history.  Social History   Tobacco Use  . Smoking status: Current Every Day Smoker    Packs/day: 0.50    Years: 10.00    Pack years: 5.00    Types: Cigarettes  . Smokeless tobacco: Never Used  Substance Use Topics  . Alcohol use: No  . Drug use: No    Home Medications Prior to Admission medications   Medication Sig Start Date End Date Taking? Authorizing Provider  QUEtiapine (SEROQUEL XR) 300 MG 24 hr tablet Take 300 mg by mouth at bedtime.   Yes [provider]  benzonatate (TESSALON) 100 MG capsule Take 1 capsule (100 mg total) by mouth every 8 (eight) hours as needed for cough. Patient not taking: Reported on 11/09/2019 04/05/15   Forde Dandy, MD  cyclobenzaprine (FLEXERIL) 5 MG tablet Take 1 tablet (5 mg total) by mouth 3 (three) times daily. Patient not  taking: Reported on 11/09/2019 08/11/15   Junius Creamer, NP  ondansetron (ZOFRAN) 4 MG tablet Take 1 tablet (4 mg total) by mouth every 8 (eight) hours as needed for nausea or vomiting. Patient not taking: Reported on 04/05/2015 11/19/14   Domenic Moras, PA-C  traMADol (ULTRAM) 50 MG tablet Take 1 tablet (50 mg total) by mouth every 6 (six) hours as needed. Patient not taking: Reported on 11/09/2019 08/11/15   Junius Creamer, NP    Allergies    Vicodin [hydrocodone-acetaminophen]  Review of Systems   Review of Systems  Unable to perform ROS: Mental status change    Physical Exam Updated Vital Signs BP 105/69   Pulse 70   Temp 98 F (36.7 C) (Oral)   Resp (!) 21   Ht 5\' 6"  (1.676 m)   Wt 113.4 kg   SpO2 97%   BMI 40.35 kg/m   Physical Exam Vitals reviewed.  Constitutional:      Comments: Sitting in bed with eyes closed.  Will open to command.  Will answer questions with somewhat slow to answer.  HENT:     Head:     Comments: Hematoma to right parietal area.  Abrasion right temple area.  No tenderness to face.    Right Ear: Tympanic membrane normal.     Left Ear: Tympanic membrane normal.  Eyes:     Extraocular Movements: Extraocular movements intact.     Pupils: Pupils are equal, round, and reactive to light.  Cardiovascular:     Rate and Rhythm: Normal rate and regular rhythm.  Pulmonary:     Breath sounds: No wheezing or rhonchi.  Abdominal:     Tenderness: There is no abdominal tenderness.  Musculoskeletal:        General: No tenderness.     Cervical back: Neck supple.  Skin:    General: Skin is warm.     Capillary Refill: Capillary refill takes less than 2 seconds.  Neurological:     Comments: Sitting in bed with eyes closed but will open to voice and command.  Moves all extremities.  Good grip strength bilaterally.  Able to answer questions appropriately but somewhat slow to answer.     ED Results / Procedures / Treatments   Labs (all labs ordered are listed, but only  abnormal results are displayed) Labs Reviewed  CBC WITH DIFFERENTIAL/PLATELET - Abnormal; Notable for the following components:      Result Value   WBC 11.6 (*)    Monocytes Absolute 1.2 (*)    Abs Immature Granulocytes 0.39 (*)    All other components within normal limits  COMPREHENSIVE METABOLIC PANEL - Abnormal; Notable for the following components:   AST 52 (*)    ALT 76 (*)    All other components within normal limits  RAPID URINE DRUG SCREEN, HOSP PERFORMED - Abnormal; Notable for the following components:   Tetrahydrocannabinol POSITIVE (*)    All other components within normal limits  URINALYSIS, ROUTINE W REFLEX MICROSCOPIC  ETHANOL  TROPONIN I (HIGH SENSITIVITY)    EKG EKG Interpretation  Date/Time:  Friday November 09 2019 20:39:09 EDT Ventricular Rate:  76 PR Interval:  162 QRS Duration: 86 QT Interval:  368 QTC Calculation: 414 R Axis:   38 Text Interpretation: Normal sinus rhythm Normal ECG Confirmed by Benjiman Core [phone]) on 11/09/2019 9:11:12 PM   Radiology CT Head Wo Contrast  Result Date: 11/09/2019 CLINICAL DATA:  Head trauma, intracranial arterial injury suspected Patient reports syncope after coughing falling face first onto pavement. EXAM: CT HEAD WITHOUT CONTRAST TECHNIQUE: Contiguous axial images were obtained from the base of the skull through the vertex without intravenous contrast. COMPARISON:  None. FINDINGS: Brain: No intracranial hemorrhage, mass effect, or midline shift. No hydrocephalus. The basilar cisterns are patent. No evidence of territorial infarct or acute ischemia. No extra-axial or intracranial fluid collection. Vascular: No hyperdense vessel or unexpected calcification. Skull: No fracture or focal lesion. Sinuses/Orbits: Paranasal sinuses and mastoid air cells are clear. The visualized orbits are unremarkable. Other: Right frontal scalp subgaleal hematoma. IMPRESSION: Right frontal scalp subgaleal hematoma. No acute intracranial  abnormality. No skull fracture. Electronically Signed   By: Narda Rutherford M.D.   On: 11/09/2019 21:39   DG Chest Portable 1 View  Result Date: 11/09/2019 CLINICAL DATA:  Syncopal episode EXAM: PORTABLE CHEST 1 VIEW COMPARISON:  07/04/2015 FINDINGS: The heart size and mediastinal contours are within normal limits. Both lungs are clear. The visualized skeletal structures are unremarkable. IMPRESSION: No active disease. Electronically Signed   By: Alcide Clever M.D.   On: 11/09/2019 21:24    Procedures Procedures (including critical care time)  Medications Ordered in ED Medications  ketorolac (TORADOL) 30 MG/ML injection 30 mg (30 mg Intramuscular Given 11/09/19 2225)    ED Course  I have reviewed the triage vital signs and the nursing notes.  Pertinent labs & imaging results that were available during my care of the patient were reviewed by me and considered in my medical decision making (see chart for details).    MDM Rules/Calculators/A&P                      Patient with fall.  Had apparent syncopal episode then fell and hit head.  Work-up overall reassuring.  Head CT reassuring.  EKG reassuring.  Lab work reassuring.  Urine drug screen did show marijuana but no other intoxicants.  Patient mental status improved.  Feeling much better.  Much more awake.  Still has headache.  Will discharge home. Final Clinical Impression(s) / ED Diagnoses Final diagnoses:  Concussion with loss of consciousness of 30 minutes or less, initial encounter  Syncope, unspecified syncope type    Rx / DC Orders ED Discharge Orders    None       Benjiman Core, MD 11/09/19 2233
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
## 2020-04-29 ENCOUNTER — Other Ambulatory Visit: Payer: Self-pay

## 2020-04-29 ENCOUNTER — Encounter (HOSPITAL_COMMUNITY): Payer: Self-pay | Admitting: Emergency Medicine

## 2020-04-29 ENCOUNTER — Ambulatory Visit (INDEPENDENT_AMBULATORY_CARE_PROVIDER_SITE_OTHER): Payer: Self-pay

## 2020-04-29 ENCOUNTER — Ambulatory Visit (HOSPITAL_COMMUNITY)
Admission: EM | Admit: 2020-04-29 | Discharge: 2020-04-29 | Disposition: A | Discharge status: Home / Self Care | Payer: Self-pay | Attending: Family Medicine | Admitting: Family Medicine

## 2020-04-29 DIAGNOSIS — R062 Wheezing: Secondary | ICD-10-CM

## 2020-04-29 DIAGNOSIS — R059 Cough, unspecified: Secondary | ICD-10-CM

## 2020-04-29 DIAGNOSIS — R05 Cough: Secondary | ICD-10-CM

## 2020-04-29 DIAGNOSIS — R0602 Shortness of breath: Secondary | ICD-10-CM

## 2020-04-29 MED ORDER — PREDNISONE 20 MG PO TABS: 40.0000 mg | ORAL_TABLET | Freq: Every day | ORAL | 0 refills | Status: DC

## 2020-04-29 MED ORDER — BENZONATATE 100 MG PO CAPS: ORAL_CAPSULE | ORAL | 0 refills | Status: DC

## 2020-04-29 NOTE — ED Provider Notes (Signed)
[facility] CARE CENTER   [id number] 04/29/20 Arrival Time: 0859  ASSESSMENT & PLAN:  1. Cough   2. Wheezing     I have personally viewed the imaging studies ordered this visit. Normal CXR.  With wheezing begin: Meds ordered this encounter  Medications   benzonatate (TESSALON) 100 MG capsule    Sig: Take 1 capsule by mouth every 8 (eight) hours for cough.    Dispense:  21 capsule    Refill:  0   predniSONE (DELTASONE) 20 MG tablet    Sig: Take 2 tablets (40 mg total) by mouth daily.    Dispense:  10 tablet    Refill:  0      Follow-up Information    [facility].   Specialty: Urgent Care Why: If worsening or failing to improve as anticipated. Contact information: [address] [place] [address] [phone]              Reviewed expectations re: course of current medical issues. Questions answered. Outlined signs and symptoms indicating need for more acute intervention. Understanding verbalized. After Visit Summary given.   SUBJECTIVE: History from: patient. Leonard Silva is a 39 y.o. male who reports persistent non-prod coughing for the past 3-4 weeks; with post-tussive emesis and "feeling like I blacked out a second after coughing so much this morning". Afebrile. No specific SOB. No chest pain. Does feel he is wheezing at times. Is a smoker. Normal PO intake.    OBJECTIVE:  Vitals:   04/29/20 0932  Pulse: 64  Resp: 18  Temp: 98 F (36.7 C)  TempSrc: Oral  SpO2: 100%    General appearance: alert; no distress Eyes: PERRLA; EOMI; conjunctiva normal HENT: [place]; AT; without nasal congestion Neck: supple  Lungs: speaks full sentences without difficulty; unlabored Extremities: no edema Skin: warm and dry Neurologic: normal gait Psychological: alert and cooperative; normal mood and affect   Imaging: DG Chest 2 View  Result Date: 04/29/2020 CLINICAL DATA:  Cough, shortness of breath EXAM: CHEST - 2 VIEW  COMPARISON:  11/09/2019 FINDINGS: The heart size and mediastinal contours are within normal limits. Both lungs are clear. The visualized skeletal structures are unremarkable. IMPRESSION: Normal study. Electronically Signed   By: Charlett Nose M.D.   On: 04/29/2020 10:25    Allergies  Allergen Reactions   Vicodin [Hydrocodone-Acetaminophen] Other (See Comments)    Gi upset    Past Medical History:  Diagnosis Date   Asthma    Pancreatitis    Social History   Socioeconomic History   Marital status: Single    Spouse name: Not on file   Number of children: Not on file   Years of education: Not on file   Highest education level: Not on file  Occupational History   Not on file  Tobacco Use   Smoking status: Current Every Day Smoker    Packs/day: 0.50    Years: 10.00    Pack years: 5.00    Types: Cigarettes   Smokeless tobacco: Never Used  Substance and Sexual Activity   Alcohol use: No   Drug use: No   Sexual activity: Yes    Birth control/protection: None  Other Topics Concern   Not on file  Social History Narrative   Not on file   Social Determinants of Health   Financial Resource Strain:    Difficulty of Paying Living Expenses: Not on file  Food Insecurity:    Worried About Running Out of  Food in the Last Year: Not on file   Ran Out of Food in the Last Year: Not on file  Transportation Needs:    Lack of Transportation (Medical): Not on file   Lack of Transportation (Non-Medical): Not on file  Physical Activity:    Days of Exercise per Week: Not on file   Minutes of Exercise per Session: Not on file  Stress:    Feeling of Stress : Not on file  Social Connections:    Frequency of Communication with Friends and Family: Not on file   Frequency of Social Gatherings with Friends and Family: Not on file   Attends Religious Services: Not on file   Active Member of Clubs or Organizations: Not on file   Attends Banker Meetings:  Not on file   Marital Status: Not on file  Intimate Partner Violence:    Fear of Current or Ex-Partner: Not on file   Emotionally Abused: Not on file   Physically Abused: Not on file   Sexually Abused: Not on file   History reviewed. No pertinent family history. History reviewed. No pertinent surgical history.   Mardella Layman, MD 04/29/20 1053
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
## 2021-02-02 ENCOUNTER — Ambulatory Visit (HOSPITAL_COMMUNITY)
Admission: EM | Admit: 2021-02-02 | Discharge: 2021-02-02 | Disposition: A | Discharge status: Home / Self Care | Payer: Self-pay

## 2021-02-02 NOTE — ED Notes (Signed)
Pt called in lobby and on cellphone, no response.

## 2021-02-02 NOTE — ED Notes (Signed)
Pt not answered.  

## 2021-06-15 ENCOUNTER — Other Ambulatory Visit: Payer: Self-pay

## 2021-06-15 ENCOUNTER — Emergency Department (HOSPITAL_COMMUNITY)
Admission: EM | Admit: 2021-06-15 | Discharge: 2021-06-15 | Disposition: A | Discharge status: Home / Self Care | Payer: Self-pay | Attending: Emergency Medicine | Admitting: Emergency Medicine

## 2021-06-15 DIAGNOSIS — J09X2 Influenza due to identified novel influenza A virus with other respiratory manifestations: Secondary | ICD-10-CM | POA: Insufficient documentation

## 2021-06-15 DIAGNOSIS — J45909 Unspecified asthma, uncomplicated: Secondary | ICD-10-CM | POA: Insufficient documentation

## 2021-06-15 DIAGNOSIS — J101 Influenza due to other identified influenza virus with other respiratory manifestations: Secondary | ICD-10-CM

## 2021-06-15 DIAGNOSIS — Z20822 Contact with and (suspected) exposure to covid-19: Secondary | ICD-10-CM | POA: Insufficient documentation

## 2021-06-15 DIAGNOSIS — F17210 Nicotine dependence, cigarettes, uncomplicated: Secondary | ICD-10-CM | POA: Insufficient documentation

## 2021-06-15 LAB — RESP PANEL BY RT-PCR (FLU A&B, COVID) ARPGX2
Influenza A by PCR: POSITIVE — AB
Influenza B by PCR: NEGATIVE
SARS Coronavirus 2 by RT PCR: NEGATIVE

## 2021-06-15 MED ORDER — ACETAMINOPHEN 500 MG PO TABS
1000.0000 mg | ORAL_TABLET | Freq: Once | ORAL | Status: AC
  Administered 2021-06-15: 1000 mg via ORAL
  Filled 2021-06-15: qty 2

## 2021-06-15 MED ORDER — IBUPROFEN 400 MG PO TABS
600.0000 mg | ORAL_TABLET | Freq: Once | ORAL | Status: AC
  Administered 2021-06-15: 600 mg via ORAL
  Filled 2021-06-15: qty 1

## 2021-06-15 MED ORDER — ONDANSETRON 4 MG PO TBDP: ORAL_TABLET | ORAL | 0 refills | Status: DC

## 2021-06-15 MED ORDER — ONDANSETRON 4 MG PO TBDP
4.0000 mg | ORAL_TABLET | Freq: Once | ORAL | Status: AC
  Administered 2021-06-15: 4 mg via ORAL
  Filled 2021-06-15: qty 1

## 2021-06-15 MED ORDER — BENZONATATE 200 MG PO CAPS: 200.0000 mg | ORAL_CAPSULE | Freq: Three times a day (TID) | ORAL | 0 refills | Status: AC | PRN

## 2021-06-15 NOTE — Discharge Instructions (Addendum)
You have the flu this is a viral infection that will likely start to improve after 7-10 days, antibiotics are not helpful in treating viral infections.  You may use Zofran as needed for nausea. Please make sure you are drinking plenty of fluids. You can treat your symptoms supportively with tylenol 1000mg  and ibuprofen 600 mg every 6 hours for fevers and pains. For nasal congestion you can use Zyrtec and Flonase to help with nasal congestion. To treat cough your can use Tessalon Perles and throat lozenges. If your symptoms are not improving please follow up with you Primary doctor.   If you develop persistent fevers, shortness of breath or difficulty breathing, chest pain, severe headache and neck pain, persistent nausea and vomiting or other new or concerning symptoms return to the Emergency department.

## 2021-06-15 NOTE — ED Provider Notes (Signed)
[facility] EMERGENCY DEPARTMENT Provider Note   CSN: [id number] Arrival date & time: 06/15/21  1135     History No chief complaint on file.   Leonard Silva is a 40 y.o. male.  Leonard Silva is a 40 y.o. male with a history of asthma and pancreatitis, who presents to the ED for 2 days of generalized body aches, headaches, chills, cough and congestion.  Patient reports that he has not had a thermometer but assumes he has been having fevers.  He reports generalized body aches that have been persistent and worsening.  Has had a sore throat and cough that has been nonproductive.  Reports associated nasal congestion.  Has had some nausea and one episode of vomiting yesterday and has also had some diarrhea but denies abdominal pain.  Chest pain only present with cough.  Unsure of sick contacts.  Symptoms not improving with TheraFlu.  No other aggravating or alleviating factors  The history is provided by the patient and medical records.      Past Medical History:  Diagnosis Date   Asthma    Pancreatitis     There are no problems to display for this patient.   No past surgical history on file.     No family history on file.  Social History   Tobacco Use   Smoking status: Every Day    Packs/day: 0.50    Years: 10.00    Pack years: 5.00    Types: Cigarettes   Smokeless tobacco: Never  Substance Use Topics   Alcohol use: No   Drug use: No    Home Medications Prior to Admission medications   Medication Sig Start Date End Date Taking? Authorizing Provider  ondansetron (ZOFRAN ODT) 4 MG disintegrating tablet 4mg  ODT q4 hours prn nausea/vomit 06/15/21  Yes Rigdon Macomber N, PA-C  benzonatate (TESSALON) 200 MG capsule Take 1 capsule (200 mg total) by mouth 3 (three) times daily as needed for up to 7 days for cough. Take 1 capsule by mouth every 8 (eight) hours for cough. 06/15/21 06/22/21  06/24/21, PA-C  predniSONE (DELTASONE) 20 MG tablet Take 2 tablets  (40 mg total) by mouth daily. 04/29/20   05/01/20, MD  QUEtiapine (SEROQUEL XR) 300 MG 24 hr tablet Take 300 mg by mouth at bedtime.    [provider]    Allergies    Vicodin [hydrocodone-acetaminophen]  Review of Systems   Review of Systems  Constitutional:  Positive for chills and fever.  HENT:  Positive for congestion, rhinorrhea and sore throat.   Respiratory:  Positive for cough. Negative for shortness of breath.   Cardiovascular:  Negative for chest pain.  Gastrointestinal:  Positive for diarrhea, nausea and vomiting. Negative for abdominal pain.  Genitourinary:  Negative for dysuria.  Musculoskeletal:  Positive for myalgias.  Skin:  Negative for color change and rash.  Neurological:  Negative for syncope and light-headedness.  All other systems reviewed and are negative.  Physical Exam Updated Vital Signs BP 101/64 (BP Location: Right Arm)   Pulse 88   Temp 99.4 F (37.4 C)   Resp 17   SpO2 98%   Physical Exam Vitals and nursing note reviewed.  Constitutional:      General: He is not in acute distress.    Appearance: Normal appearance. He is well-developed. He is not ill-appearing or diaphoretic.     Comments: Well-appearing and in no distress  HENT:     Head: Normocephalic and  atraumatic.     Nose: Rhinorrhea present.     Mouth/Throat:     Mouth: Mucous membranes are moist.     Pharynx: Oropharynx is clear.  Eyes:     General:        Right eye: No discharge.        Left eye: No discharge.  Neck:     Comments: No rigidity Cardiovascular:     Rate and Rhythm: Normal rate and regular rhythm.     Heart sounds: Normal heart sounds. No murmur heard.   No friction rub. No gallop.  Pulmonary:     Effort: Pulmonary effort is normal. No respiratory distress.     Breath sounds: Normal breath sounds.     Comments: Respirations equal and unlabored, patient able to speak in full sentences, lungs clear to auscultation bilaterally  Abdominal:     General:  Bowel sounds are normal. There is no distension.     Palpations: Abdomen is soft. There is no mass.     Tenderness: There is no abdominal tenderness. There is no guarding.     Comments: Abdomen soft, nondistended, nontender to palpation in all quadrants without guarding or peritoneal signs  Musculoskeletal:        General: No deformity.     Cervical back: Neck supple.  Lymphadenopathy:     Cervical: No cervical adenopathy.  Skin:    General: Skin is warm and dry.     Capillary Refill: Capillary refill takes less than 2 seconds.  Neurological:     Mental Status: He is alert and oriented to person, place, and time.  Psychiatric:        Mood and Affect: Mood normal.        Behavior: Behavior normal.    ED Results / Procedures / Treatments   Labs (all labs ordered are listed, but only abnormal results are displayed) Labs Reviewed  RESP PANEL BY RT-PCR (FLU A&B, COVID) ARPGX2 - Abnormal; Notable for the following components:      Result Value   Influenza A by PCR POSITIVE (*)    All other components within normal limits    EKG None  Radiology No results found.  Procedures Procedures   Medications Ordered in ED Medications  ondansetron (ZOFRAN-ODT) disintegrating tablet 4 mg (4 mg Oral Given 06/15/21 1556)  ibuprofen (ADVIL) tablet 600 mg (600 mg Oral Given 06/15/21 1556)  acetaminophen (TYLENOL) tablet 1,000 mg (1,000 mg Oral Given 06/15/21 1556)    ED Course  I have reviewed the triage vital signs and the nursing notes.  Pertinent labs & imaging results that were available during my care of the patient were reviewed by me and considered in my medical decision making (see chart for details).    MDM Rules/Calculators/A&P                           Patient with symptoms consistent with influenza.  Vitals are stable, low-grade fever. Improved with medication.  No signs of dehydration, tolerating PO's.  Nausea treated with Zofran.  Lungs are clear, no tachypnea or hypoxia,  very low suspicion for pneumonia, do not feel that chest x-ray is indicated. The patient understands that symptoms are greater than the recommended 48 hour window of treatment, no tamiflu provided.  Patient will be discharged with instructions to orally hydrate, rest, and use over-the-counter medications such as anti-inflammatories ibuprofen and Aleve for muscle aches and Tylenol for fever.  Patient will also  be given a cough suppressant and Zofran as needed for nausea.  Discussed appropriate return precautions and outpatient follow-up.  Discharged home in good condition.  Final Clinical Impression(s) / ED Diagnoses Final diagnoses:  Influenza A    Rx / DC Orders ED Discharge Orders          Ordered    benzonatate (TESSALON) 200 MG capsule  3 times daily PRN        06/15/21 1626    ondansetron (ZOFRAN ODT) 4 MG disintegrating tablet        06/15/21 1626             Jodi Geralds Sanders, [place] 06/15/21 1630    Mancel Bale, MD 06/15/21 2049

## 2021-06-15 NOTE — ED Notes (Signed)
DC instructions reviewed with pt. PT verbalized understanding. Pt DC °

## 2021-06-15 NOTE — ED Triage Notes (Signed)
Pt with two days of generalized body aches, headache, and hot flashes.Taking Theraflu without relief.

## 2021-09-09 ENCOUNTER — Ambulatory Visit
Admission: RE | Admit: 2021-09-09 | Discharge: 2021-09-09 | Disposition: A | Discharge status: Home / Self Care | Payer: Self-pay | Source: Ambulatory Visit | Attending: Internal Medicine | Admitting: Internal Medicine

## 2021-09-09 ENCOUNTER — Other Ambulatory Visit: Payer: Self-pay

## 2021-09-09 VITALS — BP 118/79 | HR 75 | Temp 98.2°F | Resp 18

## 2021-09-09 DIAGNOSIS — L239 Allergic contact dermatitis, unspecified cause: Secondary | ICD-10-CM

## 2021-09-09 MED ORDER — PREDNISONE 20 MG PO TABS: 40.0000 mg | ORAL_TABLET | Freq: Every day | ORAL | 0 refills | Status: AC

## 2021-09-09 NOTE — Discharge Instructions (Signed)
It appears that you are having an allergic reaction.  This is being treated with prednisone steroid.

## 2021-09-09 NOTE — ED Triage Notes (Signed)
2 day h/o pruritic rash that started on his upper back before spreading to his lower back, bilateral neck and face.  Has tried cortisone w/o relief. No new products used. No new foods.

## 2021-09-09 NOTE — ED Provider Notes (Signed)
[facility]    CSN: [id number] Arrival date & time: 09/09/21  1813      History   Chief Complaint Chief Complaint  Patient presents with   Rash    HPI Leonard Silva is a 41 y.o. male.   Patient presents with 2-day history of rash that is present to neck, bilateral shoulders, entirety of back, face.  He reports that the rash started on the neck and has spread.  Rash is itchy at times but not painful.  Denies any fevers.  He does report that he changed his soap recently but no other changes to the environment.  Tried hydrocortisone with no improvement. Denies shortness of breath.    Rash  Past Medical History:  Diagnosis Date   Asthma    Pancreatitis     There are no problems to display for this patient.   History reviewed. No pertinent surgical history.     Home Medications    Prior to Admission medications   Medication Sig Start Date End Date Taking? Authorizing Provider  predniSONE (DELTASONE) 20 MG tablet Take 2 tablets (40 mg total) by mouth daily for 5 days. 09/09/21 09/14/21 Yes Teodora Medici, FNP  ondansetron (ZOFRAN ODT) 4 MG disintegrating tablet 4mg  ODT q4 hours prn nausea/vomit 06/15/21   Jacqlyn Larsen, PA-C  QUEtiapine (SEROQUEL XR) 300 MG 24 hr tablet Take 300 mg by mouth at bedtime.    [provider]    Family History Family History  Problem Relation Age of Onset   Cancer Maternal Grandmother    Hypertension Maternal Grandmother     Social History Social History   Tobacco Use   Smoking status: Every Day    Packs/day: 0.50    Years: 10.00    Pack years: 5.00    Types: Cigarettes   Smokeless tobacco: Never  Substance Use Topics   Alcohol use: No   Drug use: No     Allergies   Vicodin [hydrocodone-acetaminophen]   Review of Systems Review of Systems Per  HPI  Physical Exam Triage Vital Signs ED Triage Vitals  Enc Vitals Group     BP 09/09/21 1825 118/79     Pulse Rate 09/09/21 1825 75     Resp 09/09/21  1825 18     Temp 09/09/21 1825 98.2 F (36.8 C)     Temp Source 09/09/21 1825 Oral     SpO2 09/09/21 1825 98 %     Weight --      Height --      Head Circumference --      Peak Flow --      Pain Score 09/09/21 1827 0     Pain Loc --      Pain Edu? --      Excl. in [place]? --    No data found.  Updated Vital Signs BP 118/79 (BP Location: Left Arm)    Pulse 75    Temp 98.2 F (36.8 C) (Oral)    Resp 18    SpO2 98%   Visual Acuity Right Eye Distance:   Left Eye Distance:   Bilateral Distance:    Right Eye Near:   Left Eye Near:    Bilateral Near:     Physical Exam Constitutional:      General: He is not in acute distress.    Appearance: Normal appearance. He is not toxic-appearing or diaphoretic.  HENT:     Head: Normocephalic and atraumatic.  Eyes:  Extraocular Movements: Extraocular movements intact.     Conjunctiva/sclera: Conjunctivae normal.  Pulmonary:     Effort: Pulmonary effort is normal.  Skin:    Comments: Maculopapular rash present throughout posterior neck, entirety of back, bilateral shoulders, forehead, bilateral cheeks of face.  Neurological:     General: No focal deficit present.     Mental Status: He is alert and oriented to person, place, and time. Mental status is at baseline.  Psychiatric:        Mood and Affect: Mood normal.        Behavior: Behavior normal.        Thought Content: Thought content normal.        Judgment: Judgment normal.     UC Treatments / Results  Labs (all labs ordered are listed, but only abnormal results are displayed) Labs Reviewed - No data to display  EKG   Radiology No results found.  Procedures Procedures (including critical care time)  Medications Ordered in UC Medications - No data to display  Initial Impression / Assessment and Plan / UC Course  I have reviewed the triage vital signs and the nursing notes.  Pertinent labs & imaging results that were available during my care of the patient were  reviewed by me and considered in my medical decision making (see chart for details).     Physical exam is consistent with a possible allergic contact dermatitis.  Will treat with prednisone given facial involvement. Discussed return precautions.  Patient verbalized understanding and was agreeable with plan. Final Clinical Impressions(s) / UC Diagnoses   Final diagnoses:  Allergic contact dermatitis, unspecified trigger     Discharge Instructions      It appears that you are having an allergic reaction.  This is being treated with prednisone steroid.    ED Prescriptions     Medication Sig Dispense Auth. Provider   predniSONE (DELTASONE) 20 MG tablet Take 2 tablets (40 mg total) by mouth daily for 5 days. 10 tablet Teodora Medici, [place]      PDMP not reviewed this encounter.   Teodora Medici, [place] 09/09/21 [phone]

## 2022-07-24 ENCOUNTER — Other Ambulatory Visit: Payer: Self-pay

## 2022-07-24 ENCOUNTER — Emergency Department (HOSPITAL_COMMUNITY)
Admission: EM | Admit: 2022-07-24 | Discharge: 2022-07-24 | Disposition: A | Discharge status: Home / Self Care | Payer: Self-pay | Attending: Emergency Medicine | Admitting: Emergency Medicine

## 2022-07-24 DIAGNOSIS — B349 Viral infection, unspecified: Secondary | ICD-10-CM | POA: Insufficient documentation

## 2022-07-24 DIAGNOSIS — J101 Influenza due to other identified influenza virus with other respiratory manifestations: Secondary | ICD-10-CM | POA: Insufficient documentation

## 2022-07-24 DIAGNOSIS — Z1152 Encounter for screening for COVID-19: Secondary | ICD-10-CM | POA: Insufficient documentation

## 2022-07-24 LAB — RESP PANEL BY RT-PCR (RSV, FLU A&B, COVID)  RVPGX2
Influenza A by PCR: NEGATIVE
Influenza B by PCR: POSITIVE — AB
Resp Syncytial Virus by PCR: NEGATIVE
SARS Coronavirus 2 by RT PCR: NEGATIVE

## 2022-07-24 LAB — GROUP A STREP BY PCR: Group A Strep by PCR: NOT DETECTED

## 2022-07-24 MED ORDER — KETOROLAC TROMETHAMINE 30 MG/ML IJ SOLN
30.0000 mg | Freq: Once | INTRAMUSCULAR | Status: AC
  Administered 2022-07-24: 30 mg via INTRAMUSCULAR
  Filled 2022-07-24: qty 1

## 2022-07-24 MED ORDER — DEXAMETHASONE SODIUM PHOSPHATE 10 MG/ML IJ SOLN
8.0000 mg | Freq: Once | INTRAMUSCULAR | Status: AC
  Administered 2022-07-24: 8 mg via INTRAMUSCULAR
  Filled 2022-07-24: qty 1

## 2022-07-24 NOTE — ED Triage Notes (Signed)
Patient reports sore throat with swelling onset this week .

## 2022-07-24 NOTE — ED Provider Notes (Signed)
[facility] EMERGENCY DEPARTMENT Provider Note   CSN: [id number] Arrival date & time: 07/24/22  0654     History  Chief Complaint  Patient presents with   Sore Throat    Leonard Silva is a 41 y.o. male presenting with headache, cough, congestion, sore throat, diarrhea, muscle aches.  He reports that he had several children in the house in the past week and either had influenza or RSV or COVID.  He has been feeling ill for about 2 days.  HPI     Home Medications Prior to Admission medications   Medication Sig Start Date End Date Taking? Authorizing Provider  ondansetron (ZOFRAN ODT) 4 MG disintegrating tablet 4mg  ODT q4 hours prn nausea/vomit 06/15/21   13/7/22, PA-C  QUEtiapine (SEROQUEL XR) 300 MG 24 hr tablet Take 300 mg by mouth at bedtime.    [provider]      Allergies    Vicodin [hydrocodone-acetaminophen]    Review of Systems   Review of Systems  Physical Exam Updated Vital Signs BP 134/85 (BP Location: Right Arm)   Pulse 79   Temp 97.9 F (36.6 C) (Oral)   Resp 18   SpO2 98%  Physical Exam Constitutional:      General: He is not in acute distress. HENT:     Head: Normocephalic and atraumatic.     Comments: Bilateral tonsillar prominence without exudate, no unilateral uvula deviation Eyes:     Conjunctiva/sclera: Conjunctivae normal.     Pupils: Pupils are equal, round, and reactive to light.  Cardiovascular:     Rate and Rhythm: Normal rate and regular rhythm.  Pulmonary:     Effort: Pulmonary effort is normal. No respiratory distress.  Abdominal:     General: There is no distension.     Tenderness: There is no abdominal tenderness.  Skin:    General: Skin is warm and dry.  Neurological:     General: No focal deficit present.     Mental Status: He is alert. Mental status is at baseline.  Psychiatric:        Mood and Affect: Mood normal.        Behavior: Behavior normal.     ED Results / Procedures /  Treatments   Labs (all labs ordered are listed, but only abnormal results are displayed) Labs Reviewed  GROUP A STREP BY PCR  RESP PANEL BY RT-PCR (RSV, FLU A&B, COVID)  [id number]    EKG None  Radiology No results found.  Procedures Procedures    Medications Ordered in ED Medications  dexamethasone (DECADRON) injection 8 mg (has no administration in time range)  ketorolac (TORADOL) 30 MG/ML injection 30 mg (30 mg Intramuscular Given 07/24/22 1224)    ED Course/ Medical Decision Making/ A&P                           Medical Decision Making Risk Prescription drug management.   Patient is here with constellation of symptoms which are most strongly suggestive of a viral syndrome.  Especially with sick contacts in the house.  His strep test is negative and clinically have a low suspicion for strep pharyngitis.  Do not see an indication for antibiotics.  He is not in any respiratory distress requiring hospitalization.  Do not see signs of significant dehydration.  I strongly suspect is a viral syndrome and we will test him for COVID, flu and RSV, he will follow-up on  the test results this afternoon.  He was given intramuscular Decadron and Toradol to help with sore throat and headache.  Advised conservative management otherwise and staying hydrated at home for the duration of this illness.  He verbalized understanding.  Leonard Silva was evaluated in Emergency Department on 07/24/2022 for the symptoms described in the history of present illness. He was evaluated in the context of the global COVID-19 pandemic, which necessitated consideration that the patient might be at risk for infection with the SARS-CoV-2 virus that causes COVID-19. Institutional protocols and algorithms that pertain to the evaluation of patients at risk for COVID-19 are in a state of rapid change based on information released by regulatory bodies including the CDC and federal and state organizations. These policies and  algorithms were followed during the patient's care in the ED.         Final Clinical Impression(s) / ED Diagnoses Final diagnoses:  Viral syndrome    Rx / DC Orders ED Discharge Orders     None         Terald Sleeper, MD 07/24/22 1226

## 2023-02-06 ENCOUNTER — Emergency Department (HOSPITAL_COMMUNITY)
Admission: EM | Admit: 2023-02-06 | Discharge: 2023-02-07 | Disposition: A | Discharge status: Home / Self Care | Payer: 59 | Attending: Emergency Medicine | Admitting: Emergency Medicine

## 2023-02-06 ENCOUNTER — Other Ambulatory Visit: Payer: Self-pay

## 2023-02-06 DIAGNOSIS — R252 Cramp and spasm: Secondary | ICD-10-CM

## 2023-02-06 DIAGNOSIS — R61 Generalized hyperhidrosis: Secondary | ICD-10-CM

## 2023-02-06 DIAGNOSIS — M791 Myalgia, unspecified site: Secondary | ICD-10-CM | POA: Diagnosis present

## 2023-02-06 DIAGNOSIS — R109 Unspecified abdominal pain: Secondary | ICD-10-CM | POA: Insufficient documentation

## 2023-02-06 DIAGNOSIS — R34 Anuria and oliguria: Secondary | ICD-10-CM | POA: Diagnosis not present

## 2023-02-06 LAB — COMPREHENSIVE METABOLIC PANEL
ALT: 39 U/L (ref 0–44)
AST: 53 U/L — ABNORMAL HIGH (ref 15–41)
Albumin: 4.8 g/dL (ref 3.5–5.0)
Alkaline Phosphatase: 60 U/L (ref 38–126)
Anion gap: 12 (ref 5–15)
BUN: 12 mg/dL (ref 6–20)
CO2: 18 mmol/L — ABNORMAL LOW (ref 22–32)
Calcium: 9.6 mg/dL (ref 8.9–10.3)
Chloride: 108 mmol/L (ref 98–111)
Creatinine, Ser: 1.28 mg/dL — ABNORMAL HIGH (ref 0.61–1.24)
GFR, Estimated: >60 mL/min (ref >60)
Glucose, Bld: 103 mg/dL — ABNORMAL HIGH (ref 70–99)
Potassium: 3.1 mmol/L — ABNORMAL LOW (ref 3.5–5.1)
Sodium: 138 mmol/L (ref 135–145)
Total Bilirubin: 1.2 mg/dL (ref 0.3–1.2)
Total Protein: 8.5 g/dL — ABNORMAL HIGH (ref 6.5–8.1)

## 2023-02-06 LAB — CBC WITH DIFFERENTIAL/PLATELET
Abs Immature Granulocytes: 0.07 10*3/uL (ref 0.00–0.07)
Basophils Absolute: 0.1 10*3/uL (ref 0.0–0.1)
Basophils Relative: 1 %
Eosinophils Absolute: 0 10*3/uL (ref 0.0–0.5)
Eosinophils Relative: 0 %
HCT: 48.4 % (ref 39.0–52.0)
Hemoglobin: 16.4 g/dL (ref 13.0–17.0)
Immature Granulocytes: 1 %
Lymphocytes Relative: 20 %
Lymphs Abs: 3.1 10*3/uL (ref 0.7–4.0)
MCH: 28.9 pg (ref 26.0–34.0)
MCHC: 33.9 g/dL (ref 30.0–36.0)
MCV: 85.4 fL (ref 80.0–100.0)
Monocytes Absolute: 1.8 10*3/uL — ABNORMAL HIGH (ref 0.1–1.0)
Monocytes Relative: 12 %
Neutro Abs: 10 10*3/uL — ABNORMAL HIGH (ref 1.7–7.7)
Neutrophils Relative %: 66 %
Platelets: 229 10*3/uL (ref 150–400)
RBC: 5.67 MIL/uL (ref 4.22–5.81)
RDW: 13.2 % (ref 11.5–15.5)
WBC: 15 10*3/uL — ABNORMAL HIGH (ref 4.0–10.5)
nRBC: 0 % (ref 0.0–0.2)

## 2023-02-06 LAB — TROPONIN I (HIGH SENSITIVITY): Troponin I (High Sensitivity): 13 ng/L (ref <18)

## 2023-02-06 LAB — RAPID URINE DRUG SCREEN, HOSP PERFORMED
Amphetamines: NOT DETECTED
Barbiturates: NOT DETECTED
Benzodiazepines: NOT DETECTED
Cocaine: POSITIVE — AB
Opiates: NOT DETECTED
Tetrahydrocannabinol: POSITIVE — AB

## 2023-02-06 LAB — CK: Total CK: 1732 U/L — ABNORMAL HIGH (ref 49–397)

## 2023-02-06 MED ORDER — POTASSIUM CHLORIDE CRYS ER 20 MEQ PO TBCR
40.0000 meq | EXTENDED_RELEASE_TABLET | Freq: Once | ORAL | Status: AC
  Administered 2023-02-06: 40 meq via ORAL
  Filled 2023-02-06: qty 2

## 2023-02-06 MED ORDER — LACTATED RINGERS IV BOLUS
1000.0000 mL | Freq: Once | INTRAVENOUS | Status: AC
  Administered 2023-02-06: 1000 mL via INTRAVENOUS

## 2023-02-06 NOTE — ED Triage Notes (Signed)
LLQ cramping that started 3 days ago. Endorses L hand cramping, leg cramping, and back spasming as well, decreased urine output, dark urine, intermittent diaphoresis (present in triage).  Denies abd distention  Has been attempting pedialyte and gatorade. Spends a lot of time outside through his job.   Denies LOC.  H/o pancreatitis

## 2023-02-06 NOTE — ED Provider Notes (Signed)
[facility]EMERGENCY DEPT [facility] Emergency Department Provider Note MRN:  [id number]  Arrival date & time: 02/07/23     Chief Complaint   No chief complaint on file.   History of Present Illness   Leonard Silva is a 42 y.o. year-old male presents to the ED with chief complaint of diffuse muscle cramps, left flank pain, diaphoresis, and decreased/dark urine output.  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  He thinks that he may have over done it. He denies taking any medications or using any drugs.  He states that this has never happened to him before.  He states that he feels very hot.  He denies chest pain or SOB.  He denies having had a fever.  History provided by patient.   Review of Systems  Pertinent positive and negative review of systems noted in HPI.    Physical Exam   Vitals:   02/06/23 2159 02/06/23 2330  BP:  (!) 136/92  Pulse:  82  Resp:  16  Temp: 99 F (37.2 C)   SpO2:  100%    CONSTITUTIONAL:  diaphoretic-appearing, NAD NEURO:  Alert and oriented x 3, CN 3-12 grossly intact EYES:  eyes equal and reactive ENT/NECK:  Supple, no stridor  CARDIO:  normal rate, regular rhythm, appears well-perfused  PULM:  No respiratory distress, CTAB GI/GU:  non-distended, no focal abdominal tenderness MSK/SPINE:  No gross deformities, no edema, moves all extremities  SKIN:  no rash, atraumatic   *Additional and/or pertinent findings included in MDM below  Diagnostic and Interventional Summary    EKG Interpretation Date/Time:  Sunday February 06 2023 22:00:57 EDT Ventricular Rate:  87 PR Interval:  147 QRS Duration:  73 QT Interval:  333 QTC Calculation: 401 R Axis:   43  Text Interpretation: Sinus rhythm Right atrial enlargement No significant change since last tracing Confirmed by Jacalyn Lefevre [phone]) on 02/06/2023 10:24:35 PM       Labs Reviewed  CBC WITH  DIFFERENTIAL/PLATELET - Abnormal; Notable for the following components:      Result Value   WBC 15.0 (*)    Neutro Abs 10.0 (*)    Monocytes Absolute 1.8 (*)    All other components within normal limits  COMPREHENSIVE METABOLIC PANEL - Abnormal; Notable for the following components:   Potassium 3.1 (*)    CO2 18 (*)    Glucose, Bld 103 (*)    Creatinine, Ser 1.28 (*)    Total Protein 8.5 (*)    AST 53 (*)    All other components within normal limits  CK - Abnormal; Notable for the following components:   Total CK 1,732 (*)    All other components within normal limits  URINALYSIS, ROUTINE W REFLEX MICROSCOPIC - Abnormal; Notable for the following components:   Color, Urine AMBER (*)    APPearance HAZY (*)    Specific Gravity, Urine 1.038 (*)    Ketones, ur 20 (*)    Protein, ur 100 (*)    All other components within normal limits  RAPID URINE DRUG SCREEN, HOSP PERFORMED - Abnormal; Notable for the following components:   Cocaine POSITIVE (*)    Tetrahydrocannabinol POSITIVE (*)    All other components within normal limits  TSH  TROPONIN I (HIGH SENSITIVITY)  TROPONIN I (HIGH SENSITIVITY)    No orders to display    Medications  lactated ringers  bolus 1,000 mL (0 mLs Intravenous Stopped 02/07/23 0049)  potassium chloride SA (KLOR-CON M) CR tablet 40 mEq (40 mEq Oral Given 02/06/23 2336)     Procedures  /  Critical Care Procedures  ED Course and Medical Decision Making  I have reviewed the triage vital signs, the nursing notes, and pertinent available records from the EMR.  Social Determinants Affecting Complexity of Care: Patient has no clinically significant social determinants affecting this chief complaint..   ED Course:    Medical Decision Making Patient here with body aches, muscle cramps, and diaphoresis.  I have some concern for rhabdo given his muscle cramps and dark urine.  Diaphoresis is concerning, but afebrile.  Will check TSH and heart markers.  Will given  fluids and reassess.  CK is only 1700.  Very mild AKI.  UDS positive for cocaine.  I suspect this is the cause of the diaphoresis, which has now resolved completely.  Patient states that he feels well now.  He does also have a mild hypokalemia, which could contribute to his muscle cramps.   He feels much better after fluids.    No chest pain.  Trops are flat.    I think he can be discharged.  Instructions and return precautions given.  Amount and/or Complexity of Data Reviewed Labs: ordered.  Risk Prescription drug management.         Consultants: No consultations were needed in caring for this patient.   Treatment and Plan: I considered admission due to patient's initial presentation, but after considering the examination and diagnostic results, patient will not require admission and can be discharged with outpatient follow-up.    Final Clinical Impressions(s) / ED Diagnoses     ICD-10-CM   1. Muscle cramps  R25.2     2. Diaphoresis  R61       ED Discharge Orders          Ordered    potassium chloride (KLOR-CON) 10 MEQ tablet   Once        02/07/23 0058              Discharge Instructions Discussed with and Provided to Patient:     Discharge Instructions      Your potassium level was low.  I believe this to be the cause of your muscle cramps.   Your drug screen was positive for cocaine and THC.  The cocaine could have caused your diaphoresis (sweating).  I recommend avoiding all illicit drugs.        Roxy Horseman, PA-C 02/07/23 Lorette Ang, April, MD 02/07/23 [phone]

## 2023-02-07 LAB — URINALYSIS, ROUTINE W REFLEX MICROSCOPIC
Bacteria, UA: NONE SEEN
Bilirubin Urine: NEGATIVE
Glucose, UA: NEGATIVE mg/dL
Hgb urine dipstick: NEGATIVE
Ketones, ur: 20 mg/dL — AB
Leukocytes,Ua: NEGATIVE
Nitrite: NEGATIVE
Protein, ur: 100 mg/dL — AB
Specific Gravity, Urine: 1.038 — ABNORMAL HIGH (ref 1.005–1.030)
pH: 5 (ref 5.0–8.0)

## 2023-02-07 LAB — TSH: TSH: 0.747 u[IU]/mL (ref 0.350–4.500)

## 2023-02-07 LAB — TROPONIN I (HIGH SENSITIVITY): Troponin I (High Sensitivity): 15 ng/L (ref <18)

## 2023-02-07 MED ORDER — POTASSIUM CHLORIDE ER 10 MEQ PO TBCR: 20.0000 meq | EXTENDED_RELEASE_TABLET | Freq: Once | ORAL | 0 refills | Status: DC

## 2023-02-07 NOTE — Discharge Instructions (Signed)
Your potassium level was low.  I believe this to be the cause of your muscle cramps.   Your drug screen was positive for cocaine and THC.  The cocaine could have caused your diaphoresis (sweating).  I recommend avoiding all illicit drugs.

## 2023-02-08 ENCOUNTER — Emergency Department (HOSPITAL_COMMUNITY)
Admission: EM | Admit: 2023-02-08 | Discharge: 2023-02-09 | Disposition: A | Discharge status: Home / Self Care | Payer: 59 | Attending: Emergency Medicine | Admitting: Emergency Medicine

## 2023-02-08 ENCOUNTER — Other Ambulatory Visit: Payer: Self-pay

## 2023-02-08 ENCOUNTER — Emergency Department (HOSPITAL_COMMUNITY): Payer: 59

## 2023-02-08 DIAGNOSIS — H5789 Other specified disorders of eye and adnexa: Secondary | ICD-10-CM | POA: Diagnosis present

## 2023-02-08 DIAGNOSIS — S0083XA Contusion of other part of head, initial encounter: Secondary | ICD-10-CM | POA: Insufficient documentation

## 2023-02-08 DIAGNOSIS — M79644 Pain in right finger(s): Secondary | ICD-10-CM | POA: Insufficient documentation

## 2023-02-08 DIAGNOSIS — H209 Unspecified iridocyclitis: Secondary | ICD-10-CM | POA: Insufficient documentation

## 2023-02-08 MED ORDER — OXYCODONE HCL 5 MG PO TABS: 5.0000 mg | ORAL_TABLET | Freq: Four times a day (QID) | ORAL | 0 refills | Status: DC | PRN

## 2023-02-08 MED ORDER — OXYCODONE HCL 5 MG PO TABS
5.0000 mg | ORAL_TABLET | Freq: Once | ORAL | Status: AC
  Administered 2023-02-08: 5 mg via ORAL
  Filled 2023-02-08: qty 1

## 2023-02-08 MED ORDER — SULFACETAMIDE-PREDNISOLONE 10-0.23 % OP SOLN: 1.0000 [drp] | OPHTHALMIC | 0 refills | Status: DC
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
## 2023-02-09 ENCOUNTER — Telehealth (HOSPITAL_COMMUNITY): Payer: Self-pay | Admitting: Emergency Medicine

## 2023-02-09 MED ORDER — POLYMYXIN B-TRIMETHOPRIM 10000-0.1 UNIT/ML-% OP SOLN: 2.0000 [drp] | OPHTHALMIC | 0 refills | Status: DC

## 2023-02-09 NOTE — ED Provider Notes (Signed)
Pharmacy called and said they don't have the abx-steroid drop available and have no other combos available.  I don't see a note that PA Clark spoke with an ophthalmologist, so I will hold off on prescribing a steroid drop and just send in polytrim.     Jacalyn Lefevre, MD 02/09/23 1002

## 2023-06-02 ENCOUNTER — Ambulatory Visit (HOSPITAL_COMMUNITY)
Admission: EM | Admit: 2023-06-02 | Discharge: 2023-06-02 | Disposition: A | Discharge status: Home / Self Care | Payer: 59 | Attending: Emergency Medicine | Admitting: Emergency Medicine

## 2023-06-02 ENCOUNTER — Encounter (HOSPITAL_COMMUNITY): Payer: Self-pay

## 2023-06-02 DIAGNOSIS — R051 Acute cough: Secondary | ICD-10-CM

## 2023-06-02 MED ORDER — PROMETHAZINE-DM 6.25-15 MG/5ML PO SYRP: 5.0000 mL | ORAL_SOLUTION | Freq: Four times a day (QID) | ORAL | 0 refills | Status: AC | PRN

## 2023-06-02 NOTE — Discharge Instructions (Addendum)
The promethazine DM cough syrup can be used up to 4 times daily. If this medication makes you drowsy, take only once before bed.  You can use tylenol and/or ibuprofen if you have any aches or fever  Please return if needed

## 2023-06-02 NOTE — ED Triage Notes (Signed)
Pt presents with a 2-day history of cough with congestion.Pt reports fatigue and sore throat. Pt has not taken any medication at this time.

## 2023-06-02 NOTE — ED Provider Notes (Signed)
[facility]    CSN: [id number] Arrival date & time: 06/02/23  4098     History   Chief Complaint Chief Complaint  Patient presents with   Sore Throat   Cough   Chills    HPI Leonard Silva is a 42 y.o. male. 2-3 day history of dry cough.  Reports he will cough so hard he feels like he has to throw up.  Then he gets chills and hot sweats. Has not measured any temp Denies any nasal congestion, sore throat, abdominal pain No wheezing or shortness of breath Trying to drink fluids. Decreased appetite.  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   History reviewed. No pertinent surgical history.     Home Medications    Prior to Admission medications   Medication Sig Start Date End Date Taking? Authorizing Provider  promethazine-dextromethorphan (PROMETHAZINE-DM) 6.25-15 MG/5ML syrup Take 5 mLs by mouth 4 (four) times daily as needed for cough. 06/02/23  Yes Refujio Haymer, Lurena Joiner, PA-C    Family History Family History  Problem Relation Age of Onset   Cancer Maternal Grandmother    Hypertension Maternal Grandmother     Social History Social History   Tobacco Use   Smoking status: Every Day    Current packs/day: 0.50    Average packs/day: 0.5 packs/day for 10.0 years (5.0 ttl pk-yrs)    Types: Cigarettes   Smokeless tobacco: Never  Substance Use Topics   Alcohol use: No   Drug use: No     Allergies   Vicodin [hydrocodone-acetaminophen]   Review of Systems Review of Systems  Respiratory:  Positive for cough.    As per HPI  Physical Exam Triage Vital Signs ED Triage Vitals [06/02/23 1021]  Encounter Vitals Group     BP (!) 130/91     Systolic BP Percentile      Diastolic BP Percentile      Pulse Rate 71     Resp 16     Temp 98.4 F (36.9 C)     Temp Source Oral     SpO2 97 %     Weight      Height       Head Circumference      Peak Flow      Pain Score 0     Pain Loc      Pain Education      Exclude from Growth Chart    No data found.  Updated Vital Signs BP (!) 130/91 (BP Location: Left Arm)   Pulse 71   Temp 98.4 F (36.9 C) (Oral)   Resp 16   SpO2 97%    Physical Exam Vitals and nursing note reviewed.  Constitutional:      Appearance: He is not ill-appearing.  HENT:     Right Ear: Tympanic membrane and ear canal normal.     Left Ear: Tympanic membrane and ear canal normal.     Nose: No congestion or rhinorrhea.     Mouth/Throat:     Mouth: Mucous membranes are moist.     Pharynx: Oropharynx is clear. No posterior oropharyngeal erythema.  Eyes:     Conjunctiva/sclera: Conjunctivae normal.  Cardiovascular:     Rate and Rhythm: Normal rate and regular rhythm.     Pulses: Normal pulses.     Heart  sounds: Normal heart sounds.  Pulmonary:     Effort: Pulmonary effort is normal.     Breath sounds: Normal breath sounds.     Comments: Clear lungs throughout  Musculoskeletal:     Cervical back: Normal range of motion.  Lymphadenopathy:     Cervical: No cervical adenopathy.  Skin:    General: Skin is warm and dry.  Neurological:     Mental Status: He is alert and oriented to person, place, and time.     UC Treatments / Results  Labs (all labs ordered are listed, but only abnormal results are displayed) Labs Reviewed - No data to display  EKG   Radiology No results found.  Procedures Procedures (including critical care time)  Medications Ordered in UC Medications - No data to display  Initial Impression / Assessment and Plan / UC Course  I have reviewed the triage vital signs and the nursing notes.  Pertinent labs & imaging results that were available during my care of the patient were reviewed by me and considered in my medical decision making (see chart for details).    Afebrile and well-appearing, clear lungs.  Discussed likely viral etiology.   Offered COVID test, patient declined.  Recommend symptomatic care.  Sent Promethazine DM to use for cough.  Return and ED precautions.  Work note provided.  Patient agreeable to plan, no question at this time   Final Clinical Impressions(s) / UC Diagnoses   Final diagnoses:  Acute cough     Discharge Instructions      The promethazine DM cough syrup can be used up to 4 times daily. If this medication makes you drowsy, take only once before bed.  You can use tylenol and/or ibuprofen if you have any aches or fever  Please return if needed    ED Prescriptions     Medication Sig Dispense Auth. Provider   promethazine-dextromethorphan (PROMETHAZINE-DM) 6.25-15 MG/5ML syrup Take 5 mLs by mouth 4 (four) times daily as needed for cough. 240 mL Maxfield Gildersleeve, Lurena Joiner, PA-C      PDMP not reviewed this encounter.   Marlow Baars, [place] 06/02/23 1118

## 2023-07-27 ENCOUNTER — Ambulatory Visit (HOSPITAL_COMMUNITY): Payer: Self-pay

## 2024-04-23 ENCOUNTER — Other Ambulatory Visit: Payer: Self-pay

## 2024-04-23 ENCOUNTER — Encounter (HOSPITAL_COMMUNITY): Payer: Self-pay

## 2024-04-23 ENCOUNTER — Emergency Department (HOSPITAL_COMMUNITY)
Admission: EM | Admit: 2024-04-23 | Discharge: 2024-04-23 | Disposition: A | Discharge status: Against Medical Advice | Payer: Self-pay

## 2024-04-23 DIAGNOSIS — Z5321 Procedure and treatment not carried out due to patient leaving prior to being seen by health care provider: Secondary | ICD-10-CM | POA: Insufficient documentation

## 2024-04-23 DIAGNOSIS — R059 Cough, unspecified: Secondary | ICD-10-CM | POA: Insufficient documentation

## 2024-04-23 DIAGNOSIS — R1110 Vomiting, unspecified: Secondary | ICD-10-CM | POA: Insufficient documentation

## 2024-04-23 DIAGNOSIS — R109 Unspecified abdominal pain: Secondary | ICD-10-CM | POA: Insufficient documentation

## 2024-04-23 DIAGNOSIS — R6883 Chills (without fever): Secondary | ICD-10-CM | POA: Insufficient documentation

## 2024-04-23 LAB — COMPREHENSIVE METABOLIC PANEL WITH GFR
ALT: 35 U/L (ref 0–44)
AST: 29 U/L (ref 15–41)
Albumin: 4.2 g/dL (ref 3.5–5.0)
Alkaline Phosphatase: 67 U/L (ref 38–126)
Anion gap: 12 (ref 5–15)
BUN: 7 mg/dL (ref 6–20)
CO2: 25 mmol/L (ref 22–32)
Calcium: 9.8 mg/dL (ref 8.9–10.3)
Chloride: 100 mmol/L (ref 98–111)
Creatinine, Ser: 1.17 mg/dL (ref 0.61–1.24)
GFR, Estimated: >60 mL/min (ref >60)
Glucose, Bld: 118 mg/dL — ABNORMAL HIGH (ref 70–99)
Potassium: 4.2 mmol/L (ref 3.5–5.1)
Sodium: 137 mmol/L (ref 135–145)
Total Bilirubin: 0.9 mg/dL (ref 0.0–1.2)
Total Protein: 7.5 g/dL (ref 6.5–8.1)

## 2024-04-23 LAB — CBC
HCT: 48.5 % (ref 39.0–52.0)
Hemoglobin: 15.9 g/dL (ref 13.0–17.0)
MCH: 29.1 pg (ref 26.0–34.0)
MCHC: 32.8 g/dL (ref 30.0–36.0)
MCV: 88.7 fL (ref 80.0–100.0)
Platelets: 223 K/uL (ref 150–400)
RBC: 5.47 MIL/uL (ref 4.22–5.81)
RDW: 13.7 % (ref 11.5–15.5)
WBC: 11.6 K/uL — ABNORMAL HIGH (ref 4.0–10.5)
nRBC: 0 % (ref 0.0–0.2)

## 2024-04-23 LAB — RESP PANEL BY RT-PCR (RSV, FLU A&B, COVID)  RVPGX2
Influenza A by PCR: NEGATIVE
Influenza B by PCR: NEGATIVE
Resp Syncytial Virus by PCR: NEGATIVE
SARS Coronavirus 2 by RT PCR: NEGATIVE

## 2024-04-23 LAB — URINALYSIS, ROUTINE W REFLEX MICROSCOPIC
Bilirubin Urine: NEGATIVE
Glucose, UA: NEGATIVE mg/dL
Hgb urine dipstick: NEGATIVE
Ketones, ur: 5 mg/dL — AB
Leukocytes,Ua: NEGATIVE
Nitrite: NEGATIVE
Protein, ur: NEGATIVE mg/dL
Specific Gravity, Urine: 1.021 (ref 1.005–1.030)
pH: 8 (ref 5.0–8.0)

## 2024-04-23 LAB — LIPASE, BLOOD: Lipase: 44 U/L (ref 11–51)

## 2024-04-23 MED ORDER — ONDANSETRON 4 MG PO TBDP
4.0000 mg | ORAL_TABLET | Freq: Once | ORAL | Status: AC | PRN
  Administered 2024-04-23: 4 mg via ORAL
  Filled 2024-04-23: qty 1

## 2024-04-23 NOTE — ED Triage Notes (Addendum)
 Pt presents with abd pain, hot flashes, chills, productive cough, N/V, and diarrhea that started this AM. Denies sick contacts. Pt dis take Nyquil at 05:00 this AM without relief.

## 2024-04-23 NOTE — ED Notes (Signed)
 Called pt x3, no answer.  KM

## 2024-04-23 NOTE — ED Provider Triage Note (Signed)
 Emergency Medicine Provider Triage Evaluation Note  Leonard Silva , a 43 y.o. male  was evaluated in triage.  Pt complains of abdominal pain.  Patient reports epigastric abdominal pain with associated hot flashes and chills.  Does endorse a slightly productive cough that started today but is also endorse nausea, vomiting, diarrhea.  Prior history of pancreatitis.  Took NyQuil earlier without improvement..  Review of Systems  Positive: As above Negative: As above  Physical Exam  BP 139/87   Pulse 75   Temp 98 F (36.7 C)   Resp 18   Ht 5' 6 (1.676 m)   Wt 113.4 kg   SpO2 95%   BMI 40.35 kg/m  Gen:   Awake, no distress   Resp:  Normal effort  MSK:   Moves extremities without difficulty  Other:  Tenderness palpation primarily in the epigastrium  Medical Decision Making  Medically screening exam initiated at 1:52 PM.  Appropriate orders placed.  Leonard Silva was informed that the remainder of the evaluation will be completed by another provider, this initial triage assessment does not replace that evaluation, and the importance of remaining in the ED until their evaluation is complete.     Arlenne Kimbley A, PA-C 04/23/24 1352

## 2024-04-23 NOTE — ED Provider Notes (Signed)
 Patient eloped before I could see the patient.     Leonard Lavonia SAILOR, MD 04/23/24 2052
